# Patient Record
Sex: Male | Born: 1980 | Race: White | Hispanic: No | Marital: Single | Smoking: Never smoker
Health system: Southern US, Community
[De-identification: ages and names within clinical notes are randomized; demographics above are authoritative.]

## PROBLEM LIST (undated history)

## (undated) DIAGNOSIS — M1990 Unspecified osteoarthritis, unspecified site: Secondary | ICD-10-CM

## (undated) DIAGNOSIS — I499 Cardiac arrhythmia, unspecified: Secondary | ICD-10-CM

## (undated) DIAGNOSIS — N200 Calculus of kidney: Secondary | ICD-10-CM

---

## 2018-06-27 ENCOUNTER — Other Ambulatory Visit: Payer: Self-pay

## 2018-06-27 ENCOUNTER — Emergency Department (HOSPITAL_BASED_OUTPATIENT_CLINIC_OR_DEPARTMENT_OTHER)
Admission: EM | Admit: 2018-06-27 | Discharge: 2018-06-28 | Disposition: A | Discharge status: Home / Self Care | Payer: Self-pay | Attending: Emergency Medicine | Admitting: Emergency Medicine

## 2018-06-27 ENCOUNTER — Encounter (HOSPITAL_BASED_OUTPATIENT_CLINIC_OR_DEPARTMENT_OTHER): Payer: Self-pay | Admitting: Adult Health

## 2018-06-27 DIAGNOSIS — N200 Calculus of kidney: Secondary | ICD-10-CM | POA: Insufficient documentation

## 2018-06-27 HISTORY — DX: Unspecified osteoarthritis, unspecified site: M19.90

## 2018-06-27 HISTORY — DX: Calculus of kidney: N20.0

## 2018-06-27 LAB — CBC
HEMATOCRIT: 40.1 % (ref 39.0–52.0)
Hemoglobin: 13.5 g/dL (ref 13.0–17.0)
MCH: 28.9 pg (ref 26.0–34.0)
MCHC: 33.7 g/dL (ref 30.0–36.0)
MCV: 85.9 fL (ref 78.0–100.0)
PLATELETS: 258 10*3/uL (ref 150–400)
RBC: 4.67 MIL/uL (ref 4.22–5.81)
RDW: 12.5 % (ref 11.5–15.5)
WBC: 11.5 10*3/uL — AB (ref 4.0–10.5)

## 2018-06-27 LAB — BASIC METABOLIC PANEL
Anion gap: 9 (ref 5–15)
BUN: 22 mg/dL — AB (ref 6–20)
CALCIUM: 8.6 mg/dL — AB (ref 8.9–10.3)
CO2: 26 mmol/L (ref 22–32)
Chloride: 107 mmol/L (ref 98–111)
Creatinine, Ser: 1.53 mg/dL — ABNORMAL HIGH (ref 0.61–1.24)
GFR calc Af Amer: >60 mL/min (ref >60)
GFR, EST NON AFRICAN AMERICAN: 57 mL/min — AB (ref >60)
Glucose, Bld: 96 mg/dL (ref 70–99)
POTASSIUM: 3.5 mmol/L (ref 3.5–5.1)
SODIUM: 142 mmol/L (ref 135–145)

## 2018-06-27 MED ORDER — SODIUM CHLORIDE 0.9 % IV BOLUS
1000.0000 mL | Freq: Once | INTRAVENOUS | Status: AC
  Administered 2018-06-27: 1000 mL via INTRAVENOUS

## 2018-06-27 MED ORDER — KETOROLAC TROMETHAMINE 30 MG/ML IJ SOLN
30.0000 mg | Freq: Once | INTRAMUSCULAR | Status: AC
  Administered 2018-06-27: 30 mg via INTRAVENOUS
  Filled 2018-06-27: qty 1

## 2018-06-27 MED ORDER — ONDANSETRON HCL 4 MG/2ML IJ SOLN
4.0000 mg | Freq: Once | INTRAMUSCULAR | Status: AC
  Administered 2018-06-27: 4 mg via INTRAVENOUS
  Filled 2018-06-27: qty 2

## 2018-06-27 MED ORDER — HYDROMORPHONE HCL 1 MG/ML IJ SOLN
1.0000 mg | Freq: Once | INTRAMUSCULAR | Status: AC
  Administered 2018-06-27: 1 mg via INTRAVENOUS
  Filled 2018-06-27: qty 1

## 2018-06-27 NOTE — ED Triage Notes (Signed)
PREsents with Right sided flank pain. HE states he has a kidney stone that is stuck and has been there for one month. [facility]. He would like the kidney stone taken out tonight.

## 2018-06-27 NOTE — ED Provider Notes (Signed)
[facility] EMERGENCY DEPARTMENT Provider Note   CSN: [id number] Arrival date & time: 06/27/18  2122     History   Chief Complaint Chief Complaint  Patient presents with  . Flank Pain    HPI Wayne Mayo is a 37 y.o. male.  HPI Patient reports he has had a kidney stone on the right side for about a month.  He has been taking pain medications at home which have sometimes relieve the pain.  He however has had several visits to the emergency department.  He does have a scheduled appointment with urology tomorrow.  He reports his pain got really bad again and he wants to get the stone taken out.  He reports he does not have insurance and he does not think he is going to build to go to the urologist appointment because it will require a payment for service.  He has not had vomiting.  Pain is sharp and on the right side.  It radiates into his testicle. Past Medical History:  Diagnosis Date  . Arthritis   . Kidney stone     There are no active problems to display for this patient.   History reviewed. No pertinent surgical history.      Home Medications    Prior to Admission medications   Medication Sig Start Date End Date Taking? Authorizing Provider  ondansetron (ZOFRAN-ODT) 4 MG disintegrating tablet Take by mouth. 06/25/18 07/02/18 Yes [provider]  oxyCODONE-acetaminophen (PERCOCET/ROXICET) 5-325 MG tablet TAKE 1 TABLET BY MOUTH EVERY 4 HOURS AS NEEDED UP TO FOR 5 DAYS 06/25/18  Yes [provider]  ketorolac (TORADOL) 10 MG tablet Take 10 mg by mouth every 6 (six) hours as needed. 06/20/18   [provider]    Family History History reviewed. No pertinent family history.  Social History Social History   Tobacco Use  . Smoking status: Never Smoker  . Smokeless tobacco: Never Used  Substance Use Topics  . Alcohol use: Never    Frequency: Never  . Drug use: Never     Allergies   Patient has no known allergies.   Review  of Systems Review of Systems 10 Systems reviewed and are negative for acute change except as noted in the HPI.   Physical Exam Updated Vital Signs BP 108/71 (BP Location: Right Arm)   Pulse (!) 57   Temp 97.8 F (36.6 C) (Oral)   Resp 16   Wt 59 kg   SpO2 95%   Physical Exam  Constitutional: He is oriented to person, place, and time. He appears well-developed and well-nourished.  Patient appears to be in moderate pain.  Nontoxic and alert.  HENT:  Head: Normocephalic and atraumatic.  Eyes: EOM are normal.  Cardiovascular: Normal rate, regular rhythm, normal heart sounds and intact distal pulses.  Pulmonary/Chest: Effort normal and breath sounds normal.  Abdominal: Soft. Bowel sounds are normal. He exhibits no distension. There is no tenderness. There is no guarding.  Musculoskeletal: Normal range of motion. He exhibits no edema.  Neurological: He is alert and oriented to person, place, and time. He exhibits normal muscle tone. Coordination normal.  Skin: Skin is warm and dry.  Psychiatric: He has a normal mood and affect.     ED Treatments / Results  Labs (all labs ordered are listed, but only abnormal results are displayed) Labs Reviewed  URINALYSIS, ROUTINE W REFLEX MICROSCOPIC - Abnormal; Notable for the following components:      Result Value   APPearance  CLOUDY (*)    Hgb urine dipstick LARGE (*)    All other components within normal limits  BASIC METABOLIC PANEL - Abnormal; Notable for the following components:   BUN 22 (*)    Creatinine, Ser 1.53 (*)    Calcium 8.6 (*)    GFR calc non Af Amer 57 (*)    All other components within normal limits  CBC - Abnormal; Notable for the following components:   WBC 11.5 (*)    All other components within normal limits  URINALYSIS, MICROSCOPIC (REFLEX)    EKG None  Radiology No results found.  Procedures Procedures (including critical care time)  Medications Ordered in ED Medications  sodium chloride 0.9 %  bolus 1,000 mL (1,000 mLs Intravenous New Bag/Given 06/27/18 2328)  HYDROmorphone (DILAUDID) injection 1 mg (1 mg Intravenous Given 06/27/18 2332)  ondansetron (ZOFRAN) injection 4 mg (4 mg Intravenous Given 06/27/18 2329)  ketorolac (TORADOL) 30 MG/ML injection 30 mg (30 mg Intravenous Given 06/27/18 2330)     Initial Impression / Assessment and Plan / ED Course  I have reviewed the triage vital signs and the nursing notes.  Pertinent labs & imaging results that were available during my care of the patient were reviewed by me and considered in my medical decision making (see chart for details).     Final Clinical Impressions(s) / ED Diagnoses   Final diagnoses:  Kidney stone   Patient's pain is much improved with treatment.  He is alert and nontoxic.  Urinalysis shows no signs of infection.  He had CT scan 2 days ago which did show some movement of his kidney stone.  And has follow-up with urology scheduled within less than 24 hours.  Patient is counseled on return precautions.  He is counseled to strain his urine. ED Discharge Orders    None       Arby BarrettePfeiffer, Theresea Trautmann, MD 06/28/18 [phone]

## 2018-06-27 NOTE — ED Notes (Signed)
ED Provider at bedside. 

## 2018-06-28 LAB — URINALYSIS, ROUTINE W REFLEX MICROSCOPIC
Bilirubin Urine: NEGATIVE
Glucose, UA: NEGATIVE mg/dL
Ketones, ur: NEGATIVE mg/dL
Leukocytes, UA: NEGATIVE
Nitrite: NEGATIVE
PROTEIN: NEGATIVE mg/dL
Specific Gravity, Urine: 1.015 (ref 1.005–1.030)
pH: 5.5 (ref 5.0–8.0)

## 2018-06-28 LAB — URINALYSIS, MICROSCOPIC (REFLEX): Bacteria, UA: NONE SEEN

## 2018-06-28 NOTE — Discharge Instructions (Addendum)
1.  Try to make your scheduled appointment with urology today. 2.  Stay well-hydrated and strain your urine. 3.  If you have severe and worsening pain, return to the emergency department.

## 2018-08-09 ENCOUNTER — Emergency Department (HOSPITAL_BASED_OUTPATIENT_CLINIC_OR_DEPARTMENT_OTHER): Payer: Self-pay

## 2018-08-09 ENCOUNTER — Encounter (HOSPITAL_BASED_OUTPATIENT_CLINIC_OR_DEPARTMENT_OTHER): Payer: Self-pay | Admitting: *Deleted

## 2018-08-09 ENCOUNTER — Other Ambulatory Visit: Payer: Self-pay

## 2018-08-09 ENCOUNTER — Emergency Department (HOSPITAL_BASED_OUTPATIENT_CLINIC_OR_DEPARTMENT_OTHER)
Admission: EM | Admit: 2018-08-09 | Discharge: 2018-08-09 | Disposition: A | Discharge status: Home / Self Care | Payer: Self-pay | Attending: Emergency Medicine | Admitting: Emergency Medicine

## 2018-08-09 DIAGNOSIS — R1084 Generalized abdominal pain: Secondary | ICD-10-CM

## 2018-08-09 DIAGNOSIS — R112 Nausea with vomiting, unspecified: Secondary | ICD-10-CM | POA: Insufficient documentation

## 2018-08-09 DIAGNOSIS — R1031 Right lower quadrant pain: Secondary | ICD-10-CM | POA: Insufficient documentation

## 2018-08-09 LAB — COMPREHENSIVE METABOLIC PANEL
ALT: 33 U/L (ref 0–44)
AST: 23 U/L (ref 15–41)
Albumin: 4.3 g/dL (ref 3.5–5.0)
Alkaline Phosphatase: 101 U/L (ref 38–126)
Anion gap: 9 (ref 5–15)
BUN: 21 mg/dL — ABNORMAL HIGH (ref 6–20)
CO2: 27 mmol/L (ref 22–32)
Calcium: 9.1 mg/dL (ref 8.9–10.3)
Chloride: 105 mmol/L (ref 98–111)
Creatinine, Ser: 0.91 mg/dL (ref 0.61–1.24)
GFR calc Af Amer: >60 mL/min (ref >60)
GFR calc non Af Amer: >60 mL/min (ref >60)
Glucose, Bld: 87 mg/dL (ref 70–99)
Potassium: 4.1 mmol/L (ref 3.5–5.1)
Sodium: 141 mmol/L (ref 135–145)
Total Bilirubin: 0.6 mg/dL (ref 0.3–1.2)
Total Protein: 7 g/dL (ref 6.5–8.1)

## 2018-08-09 LAB — CBC WITH DIFFERENTIAL/PLATELET
Basophils Absolute: 0 10*3/uL (ref 0.0–0.1)
Basophils Relative: 1 %
Eosinophils Absolute: 0.2 10*3/uL (ref 0.0–0.7)
Eosinophils Relative: 2 %
HCT: 47.6 % (ref 39.0–52.0)
Hemoglobin: 16.1 g/dL (ref 13.0–17.0)
Lymphocytes Relative: 24 %
Lymphs Abs: 2 10*3/uL (ref 0.7–4.0)
MCH: 28.6 pg (ref 26.0–34.0)
MCHC: 33.8 g/dL (ref 30.0–36.0)
MCV: 84.7 fL (ref 78.0–100.0)
Monocytes Absolute: 0.6 10*3/uL (ref 0.1–1.0)
Monocytes Relative: 7 %
Neutro Abs: 5.5 10*3/uL (ref 1.7–7.7)
Neutrophils Relative %: 66 %
Platelets: 279 10*3/uL (ref 150–400)
RBC: 5.62 MIL/uL (ref 4.22–5.81)
RDW: 13 % (ref 11.5–15.5)
WBC: 8.3 10*3/uL (ref 4.0–10.5)

## 2018-08-09 LAB — URINALYSIS, ROUTINE W REFLEX MICROSCOPIC
Bilirubin Urine: NEGATIVE
Glucose, UA: NEGATIVE mg/dL
Hgb urine dipstick: NEGATIVE
Ketones, ur: NEGATIVE mg/dL
Leukocytes, UA: NEGATIVE
Nitrite: NEGATIVE
Protein, ur: NEGATIVE mg/dL
Specific Gravity, Urine: >1.03 — ABNORMAL HIGH (ref 1.005–1.030)
pH: 5.5 (ref 5.0–8.0)

## 2018-08-09 LAB — LIPASE, BLOOD: Lipase: 34 U/L (ref 11–51)

## 2018-08-09 MED ORDER — ONDANSETRON HCL 4 MG PO TABS: 4.0000 mg | ORAL_TABLET | Freq: Four times a day (QID) | ORAL | 0 refills | Status: AC | PRN

## 2018-08-09 MED ORDER — MORPHINE SULFATE (PF) 4 MG/ML IV SOLN
4.0000 mg | Freq: Once | INTRAVENOUS | Status: AC
  Administered 2018-08-09: 4 mg via INTRAVENOUS
  Filled 2018-08-09: qty 1

## 2018-08-09 MED ORDER — SODIUM CHLORIDE 0.9 % IV BOLUS
1000.0000 mL | Freq: Once | INTRAVENOUS | Status: AC
  Administered 2018-08-09: 1000 mL via INTRAVENOUS

## 2018-08-09 MED ORDER — IOPAMIDOL (ISOVUE-300) INJECTION 61%
100.0000 mL | Freq: Once | INTRAVENOUS | Status: AC | PRN
  Administered 2018-08-09: 100 mL via INTRAVENOUS

## 2018-08-09 MED ORDER — ONDANSETRON HCL 4 MG/2ML IJ SOLN
4.0000 mg | Freq: Once | INTRAMUSCULAR | Status: AC
  Administered 2018-08-09: 4 mg via INTRAVENOUS
  Filled 2018-08-09: qty 2

## 2018-08-09 NOTE — ED Triage Notes (Signed)
Pt c/o diffuse abd pain x 1 day , seen at [facility] ED this am . Labs resulted , left LWBS

## 2018-08-09 NOTE — ED Provider Notes (Signed)
[facility] EMERGENCY DEPARTMENT Provider Note   CSN: [id number] Arrival date & time: 08/09/18  1832     History   Chief Complaint Chief Complaint  Patient presents with  . Abdominal Pain    HPI Wayne Mayo is a 37 y.o. male.  HPI   37 year old male with diffuse abdominal pain and nausea/vomiting.  Symptom onset 2 days ago.  Persistent since then.  Abdominal pain is diffuse but somewhat worse on the right side.  Associate with nausea and vomited several times.  No diarrhea.  No urinary complaints.  No fevers or chills.  No known sick contacts.  Has not tried taking anything for his symptoms.  Past Medical History:  Diagnosis Date  . Arthritis   . Kidney stone     There are no active problems to display for this patient.   History reviewed. No pertinent surgical history.      Home Medications    Prior to Admission medications   Not on File    Family History History reviewed. No pertinent family history.  Social History Social History   Tobacco Use  . Smoking status: Never Smoker  . Smokeless tobacco: Never Used  Substance Use Topics  . Alcohol use: Never    Frequency: Never  . Drug use: Never     Allergies   Patient has no known allergies.   Review of Systems Review of Systems  All systems reviewed and negative, other than as noted in HPI.  Physical Exam Updated Vital Signs BP 118/77 (BP Location: Left Arm)   Pulse (!) 56   Temp 97.7 F (36.5 C)   Resp 16   Ht 5\' 7"  (1.702 m)   Wt 61.7 kg   SpO2 98%   BMI 21.30 kg/m   Physical Exam  Constitutional: He appears well-developed and well-nourished. No distress.  HENT:  Head: Normocephalic and atraumatic.  Eyes: Conjunctivae are normal. Right eye exhibits no discharge. Left eye exhibits no discharge.  Neck: Neck supple.  Cardiovascular: Normal rate, regular rhythm and normal heart sounds. Exam reveals no gallop and no friction rub.  No murmur heard. Pulmonary/Chest: Effort  normal and breath sounds normal. No respiratory distress.  Abdominal: Soft. He exhibits no distension. There is tenderness.  Tenderness lower abdomen, somewhat worse in RLQ  Musculoskeletal: He exhibits no edema or tenderness.  Neurological: He is alert.  Skin: Skin is warm and dry.  Psychiatric: He has a normal mood and affect. His behavior is normal. Thought content normal.  Nursing note and vitals reviewed.    ED Treatments / Results  Labs (all labs ordered are listed, but only abnormal results are displayed) Labs Reviewed  COMPREHENSIVE METABOLIC PANEL - Abnormal; Notable for the following components:      Result Value   BUN 21 (*)    All other components within normal limits  URINALYSIS, ROUTINE W REFLEX MICROSCOPIC - Abnormal; Notable for the following components:   Specific Gravity, Urine >1.030 (*)    All other components within normal limits  CBC WITH DIFFERENTIAL/PLATELET  LIPASE, BLOOD    EKG None  Radiology Ct Abdomen Pelvis W Contrast  Result Date: 08/09/2018 CLINICAL DATA:  Upper abdominal pain, emesis for 1 week. History of nephrolithiasis EXAM: CT ABDOMEN AND PELVIS WITH CONTRAST TECHNIQUE: Multidetector CT imaging of the abdomen and pelvis was performed using the standard protocol following bolus administration of intravenous contrast. CONTRAST:  ISOVUE-300 IOPAMIDOL (ISOVUE-300) INJECTION 61% COMPARISON:  None. FINDINGS: Lower chest: Dependent bibasilar atelectasis.  Normal heart size. No pericardial or pleural effusion. Hepatobiliary: No focal liver abnormality is seen. No gallstones, gallbladder wall thickening, or biliary dilatation. Pancreas: Unremarkable. No pancreatic ductal dilatation or surrounding inflammatory changes. Spleen: Normal in size without focal abnormality. Adrenals/Urinary Tract: Normal adrenal glands. Minimally complex septated hypodense left renal cyst measures 5.6 cm, image 37 series 2. No renal obstruction, hydronephrosis, perinephric  inflammatory process, hydroureter. No obstructing urinary tract or ureteral calculus on either side. Urinary bladder collapsed. Stomach/Bowel: Negative for bowel obstruction, significant dilatation, ileus, or free air. Normal retrocecal appendix. Minor colonic diverticulosis without acute inflammatory process. No fluid collection or abscess. Vascular/Lymphatic: Intact aorta. Negative for aneurysm. No acute vascular process. Mesenteric and renal vasculature remain patent. No adenopathy. Reproductive: Prostate gland normal in size. Seminal vesicles symmetric. No acute finding by CT. Other: No abdominal wall hernia or abnormality. No abdominopelvic ascites. Musculoskeletal: No acute or significant osseous findings. IMPRESSION: No acute intra-abdominopelvic finding by CT. Minimally complex septated 5.6 cm left renal cyst Normal appendix Minor colonic diverticulosis without acute inflammatory process No fluid collection, obstruction or abscess Electronically Signed   By: Judie Petit.  Shick M.D.   On: 08/09/2018 22:33    Procedures Procedures (including critical care time)  Medications Ordered in ED Medications  sodium chloride 0.9 % bolus 1,000 mL (1,000 mLs Intravenous New Bag/Given 08/09/18 2119)  ondansetron (ZOFRAN) injection 4 mg (4 mg Intravenous Given 08/09/18 2117)  morphine 4 MG/ML injection 4 mg (4 mg Intravenous Given 08/09/18 2117)  iopamidol (ISOVUE-300) 61 % injection 100 mL (100 mLs Intravenous Contrast Given 08/09/18 2208)     Initial Impression / Assessment and Plan / ED Course  I have reviewed the triage vital signs and the nursing notes.  Pertinent labs & imaging results that were available during my care of the patient were reviewed by me and considered in my medical decision making (see chart for details).     37yM with abdominal pain and n/v. Possibly viral GI illness. W/u not consistent with appy, cholelithiasis, urologic emergency, etc. Symptomatic tx.   Final Clinical Impressions(s) /  ED Diagnoses   Final diagnoses:  Generalized abdominal pain  Nausea and vomiting, intractability of vomiting not specified, unspecified vomiting type    ED Discharge Orders    None       Raeford Razor, MD 08/11/18 [phone]

## 2018-09-13 ENCOUNTER — Encounter (HOSPITAL_BASED_OUTPATIENT_CLINIC_OR_DEPARTMENT_OTHER): Payer: Self-pay | Admitting: Emergency Medicine

## 2018-09-13 ENCOUNTER — Other Ambulatory Visit: Payer: Self-pay

## 2018-09-13 ENCOUNTER — Emergency Department (HOSPITAL_BASED_OUTPATIENT_CLINIC_OR_DEPARTMENT_OTHER)
Admission: EM | Admit: 2018-09-13 | Discharge: 2018-09-13 | Disposition: A | Discharge status: Home / Self Care | Payer: Self-pay | Attending: Emergency Medicine | Admitting: Emergency Medicine

## 2018-09-13 ENCOUNTER — Emergency Department (HOSPITAL_BASED_OUTPATIENT_CLINIC_OR_DEPARTMENT_OTHER): Payer: Self-pay

## 2018-09-13 DIAGNOSIS — R079 Chest pain, unspecified: Secondary | ICD-10-CM

## 2018-09-13 DIAGNOSIS — R42 Dizziness and giddiness: Secondary | ICD-10-CM | POA: Insufficient documentation

## 2018-09-13 LAB — CBC
HEMATOCRIT: 45.6 % (ref 39.0–52.0)
HEMOGLOBIN: 14.9 g/dL (ref 13.0–17.0)
MCH: 28.1 pg (ref 26.0–34.0)
MCHC: 32.7 g/dL (ref 30.0–36.0)
MCV: 86 fL (ref 80.0–100.0)
Platelets: 260 10*3/uL (ref 150–400)
RBC: 5.3 MIL/uL (ref 4.22–5.81)
RDW: 12.1 % (ref 11.5–15.5)
WBC: 6.1 10*3/uL (ref 4.0–10.5)
nRBC: 0 % (ref 0.0–0.2)

## 2018-09-13 LAB — D-DIMER, QUANTITATIVE: D-Dimer, Quant: <0.27 ug/mL-FEU (ref 0.00–0.50)

## 2018-09-13 LAB — BASIC METABOLIC PANEL
ANION GAP: 7 (ref 5–15)
BUN: 16 mg/dL (ref 6–20)
CHLORIDE: 105 mmol/L (ref 98–111)
CO2: 27 mmol/L (ref 22–32)
Calcium: 9.1 mg/dL (ref 8.9–10.3)
Creatinine, Ser: 0.84 mg/dL (ref 0.61–1.24)
GFR calc Af Amer: >60 mL/min (ref >60)
Glucose, Bld: 98 mg/dL (ref 70–99)
POTASSIUM: 3.8 mmol/L (ref 3.5–5.1)
SODIUM: 139 mmol/L (ref 135–145)

## 2018-09-13 LAB — TROPONIN I: Troponin I: <0.03 ng/mL (ref <0.03)

## 2018-09-13 MED ORDER — ASPIRIN 325 MG PO TABS: 325.0000 mg | ORAL_TABLET | Freq: Every day | ORAL | 0 refills | Status: AC

## 2018-09-13 MED ORDER — MECLIZINE HCL 25 MG PO TABS: 25.0000 mg | ORAL_TABLET | Freq: Three times a day (TID) | ORAL | 0 refills | Status: AC | PRN

## 2018-09-13 NOTE — Discharge Instructions (Addendum)
We saw in the ER for dizziness and chest discomfort. EKG of the heart and cardiac enzymes are normal and within normal limits. We screen you for blood clot in the lungs, and that test is also normal.  We are not quite sure why you are having the symptoms.  Take the medications as prescribed and follow-up with the outpatient team is requested.  Return to the ER if your symptoms progress, you start developing new symptoms or you faint.

## 2018-09-13 NOTE — ED Triage Notes (Signed)
Reports chest pain which began this morning.  Endorses dizziness.  Reports the pain as constant.  Denies shortness of breath, nausea and vomiting.

## 2018-09-13 NOTE — ED Provider Notes (Addendum)
[facility] EMERGENCY DEPARTMENT Provider Note   CSN: [id number] Arrival date & time: 09/13/18  1046     History   Chief Complaint Chief Complaint  Patient presents with  . Chest Pain    HPI Wayne Mayo is a 37 y.o. male.  HPI  37 year old male with history of kidney stone comes in with chief complaint of chest pain. Patient reports that he started having chest pain this morning with associated dizziness.  Patient's chest pain is constant and located midsternally.  Pain is described as sharp pain that is radiating to both of his sides and patient has associated numbness in his arm, above the left elbow.  Numbness is described as tingling sensation.  Patient's associated dizziness is described as spinning sensation and it is constant.  Patient denies any slurred speech, vision changes, headache, neck pain, focal weakness.  Past Medical History:  Diagnosis Date  . Arthritis   . Kidney stone     There are no active problems to display for this patient.   History reviewed. No pertinent surgical history.      Home Medications    Prior to Admission medications   Medication Sig Start Date End Date Taking? Authorizing Provider  aspirin (BAYER ASPIRIN) 325 MG tablet Take 1 tablet (325 mg total) by mouth daily. 09/13/18   Derwood Kaplan, MD  meclizine (ANTIVERT) 25 MG tablet Take 1 tablet (25 mg total) by mouth 3 (three) times daily as needed for dizziness. 09/13/18   Derwood Kaplan, MD  ondansetron (ZOFRAN) 4 MG tablet Take 1 tablet (4 mg total) by mouth 4 (four) times daily as needed for nausea or vomiting. 08/09/18   Raeford Razor, MD    Family History History reviewed. No pertinent family history.  Social History Social History   Tobacco Use  . Smoking status: Never Smoker  . Smokeless tobacco: Never Used  Substance Use Topics  . Alcohol use: Never    Frequency: Never  . Drug use: Never     Allergies   Patient has no known  allergies.   Review of Systems Review of Systems  Constitutional: Positive for activity change.  Eyes: Negative for visual disturbance.  Respiratory: Negative for shortness of breath.   Cardiovascular: Positive for chest pain.  Gastrointestinal: Negative for abdominal pain, nausea and vomiting.  Neurological: Positive for dizziness, light-headedness and numbness. Negative for tremors, seizures, syncope, facial asymmetry, speech difficulty, weakness and headaches.  All other systems reviewed and are negative.    Physical Exam Updated Vital Signs BP 119/88 (BP Location: Right Arm)   Pulse 63   Resp (!) 21   Ht 5\' 7"  (1.702 m)   Wt 61.7 kg   SpO2 98%   BMI 21.30 kg/m   Physical Exam  Constitutional: He is oriented to person, place, and time. He appears well-developed.  HENT:  Head: Atraumatic.  Neck: Neck supple.  Cardiovascular: Normal rate, intact distal pulses and normal pulses.  Pulmonary/Chest: Effort normal.  Musculoskeletal:       Right lower leg: Normal. He exhibits no tenderness and no edema.       Left lower leg: Normal. He exhibits no tenderness and no edema.  Neurological: He is alert and oriented to person, place, and time. No cranial nerve deficit.  No nystagmus, gross motor exam for upper and lower extremities equal bilaterally.  Patient has subjective numbness over his left arm.  Skin: Skin is warm.  Nursing note and vitals reviewed.    ED Treatments /  Results  Labs (all labs ordered are listed, but only abnormal results are displayed) Labs Reviewed  BASIC METABOLIC PANEL  CBC  TROPONIN I  D-DIMER, QUANTITATIVE (NOT AT [facility])  TROPONIN I    EKG EKG Interpretation  Date/Time:  Monday September 13 2018 10:55:39 EST Ventricular Rate:  67 PR Interval:    QRS Duration: 104 QT Interval:  395 QTC Calculation: 417 R Axis:   110 Text Interpretation:  Sinus rhythm Right axis deviation No acute changes No old tracing to compare Confirmed by Derwood Kaplan [phone]) on 09/13/2018 11:00:40 AM   Radiology Dg Chest 2 View  Result Date: 09/13/2018 CLINICAL DATA:  Chest pain radiating to the left arm. EXAM: CHEST - 2 VIEW COMPARISON:  08/28/2018 FINDINGS: Normal heart size and mediastinal contours. No acute infiltrate or edema. No effusion or pneumothorax. No acute osseous findings. IMPRESSION: Negative chest. Electronically Signed   By: Marnee Spring M.D.   On: 09/13/2018 11:11    Procedures Procedures (including critical care time)  Medications Ordered in ED Medications - No data to display   Initial Impression / Assessment and Plan / ED Course  I have reviewed the triage vital signs and the nursing notes.  Pertinent labs & imaging results that were available during my care of the patient were reviewed by me and considered in my medical decision making (see chart for details).  Clinical Course as of Sep 13 1333  Mon Sep 13, 2018  1329 Delta troponin is negative. Repeat neuro exam was completed, and it remained fine.  On telemetry patient is noted to have heart rate that drops in the upper 40s, but there is no associated abnormalities on the intervals.  Patient is comfortable going home right now.  We have advised him to start taking aspirin.  He will return to the ER if he starts having worsening of his symptoms, he has new neurologic symptoms, progression of the current neurologic symptoms or syncope.   [AN]    Clinical Course User Index [AN] Derwood Kaplan, MD    37 year old male comes in with chief complaint of chest pain, dizziness and left arm numbness.  He has been having chest pain off and on for the last several days it appears and has been seen at outside hospital for it.  His current pain started this morning when he woke up.  He describes the pain as sharp pain that is radiating bilaterally. Patient does not have any family history of premature CAD, he denies smoking, substance abuse.  There is no history of PE, DVT and  no risk factors for the same.  She is well score is rates low probability for PE and he is PERC negative.  Still will get a d-dimer. She does not have any risk factors for dissection.  He has intact and equal pulses for both radial and dorsalis pedis region.  D-dimer has shown reasonable sensitivity to dissection and a low suspicion, therefore dimer will screen patient for dissection as well. I doubt that patient has ACS given that he has been having recurrent nonspecific chest pain.  Patient's heart score is 1, with one being for history.  Troponin will be ordered x 2.  Unsure what to make of patient's dizziness.  His neurologic exam is completely nonfocal.  As mentioned earlier, dissection is not high on the differential diagnosis. We will likely treat patient with Antivert.  He has been advised to follow-up with PCP.Marland Kitchen  Final Clinical Impressions(s) / ED Diagnoses  Final diagnoses:  Dizziness  Nonspecific chest pain    ED Discharge Orders         Ordered    aspirin (BAYER ASPIRIN) 325 MG tablet  Daily     09/13/18 1333    meclizine (ANTIVERT) 25 MG tablet  3 times daily PRN     09/13/18 1333           Derwood Kaplan, MD 09/13/18 1257    Derwood Kaplan, MD 09/13/18 1334

## 2018-09-13 NOTE — ED Notes (Signed)
Patient transported to X-ray 

## 2018-09-13 NOTE — ED Notes (Addendum)
Upon standing, pt felt "off balance" , nauseous and  A little pain in center of sternum. Reported to RN

## 2018-09-16 ENCOUNTER — Other Ambulatory Visit: Payer: Self-pay

## 2018-09-16 ENCOUNTER — Emergency Department (HOSPITAL_BASED_OUTPATIENT_CLINIC_OR_DEPARTMENT_OTHER)
Admission: EM | Admit: 2018-09-16 | Discharge: 2018-09-16 | Disposition: A | Discharge status: Home / Self Care | Payer: Self-pay | Attending: Emergency Medicine | Admitting: Emergency Medicine

## 2018-09-16 ENCOUNTER — Emergency Department (HOSPITAL_BASED_OUTPATIENT_CLINIC_OR_DEPARTMENT_OTHER): Payer: Self-pay

## 2018-09-16 ENCOUNTER — Encounter (HOSPITAL_BASED_OUTPATIENT_CLINIC_OR_DEPARTMENT_OTHER): Payer: Self-pay

## 2018-09-16 DIAGNOSIS — Z79899 Other long term (current) drug therapy: Secondary | ICD-10-CM | POA: Insufficient documentation

## 2018-09-16 DIAGNOSIS — R0789 Other chest pain: Secondary | ICD-10-CM | POA: Insufficient documentation

## 2018-09-16 LAB — COMPREHENSIVE METABOLIC PANEL
ALK PHOS: 106 U/L (ref 38–126)
ALT: 24 U/L (ref 0–44)
AST: 17 U/L (ref 15–41)
Albumin: 4.2 g/dL (ref 3.5–5.0)
Anion gap: 9 (ref 5–15)
BUN: 24 mg/dL — AB (ref 6–20)
CHLORIDE: 106 mmol/L (ref 98–111)
CO2: 24 mmol/L (ref 22–32)
CREATININE: 0.86 mg/dL (ref 0.61–1.24)
Calcium: 8.9 mg/dL (ref 8.9–10.3)
GFR calc Af Amer: >60 mL/min (ref >60)
GFR calc non Af Amer: >60 mL/min (ref >60)
GLUCOSE: 99 mg/dL (ref 70–99)
Potassium: 4.1 mmol/L (ref 3.5–5.1)
SODIUM: 139 mmol/L (ref 135–145)
Total Bilirubin: 0.9 mg/dL (ref 0.3–1.2)
Total Protein: 7.2 g/dL (ref 6.5–8.1)

## 2018-09-16 LAB — CBC
HCT: 46.5 % (ref 39.0–52.0)
HEMOGLOBIN: 15.3 g/dL (ref 13.0–17.0)
MCH: 28.1 pg (ref 26.0–34.0)
MCHC: 32.9 g/dL (ref 30.0–36.0)
MCV: 85.5 fL (ref 80.0–100.0)
NRBC: 0 % (ref 0.0–0.2)
PLATELETS: 306 10*3/uL (ref 150–400)
RBC: 5.44 MIL/uL (ref 4.22–5.81)
RDW: 12.2 % (ref 11.5–15.5)
WBC: 10.8 10*3/uL — ABNORMAL HIGH (ref 4.0–10.5)

## 2018-09-16 LAB — TROPONIN I

## 2018-09-16 MED ORDER — DICLOFENAC SODIUM 1 % TD GEL: 2.0000 g | Freq: Four times a day (QID) | TRANSDERMAL | 0 refills | Status: AC

## 2018-09-16 NOTE — ED Provider Notes (Signed)
[facility] EMERGENCY DEPARTMENT Provider Note   CSN: [id number] Arrival date & time: 09/16/18  1557     History   Chief Complaint Chief Complaint  Patient presents with  . Chest Pain    HPI Wayne Mayo is a 37 y.o. male with a history of nephrolithiasis and arthritis who presents to the emergency department with a chief complaint of chest pain.  The patient endorses a sudden onset, constant chest pain that began at 1600 while he was laying in his bed.  He states the pain originally began in the middle of his chest and radiated to his left shoulder blade.  He characterized the initial pain as " feeling like my rib cage was being ripped out".  He reports that pain has since subsided and he is now having some right-sided, constant, nonradiating chest pain characterized as sharp.  He reports an associated nonproductive cough and states that when he coughs that his chest pain worsens.  No known alleviating factors.  No treatment prior to arrival.  He denies neck pain, left arm pain, weakness, diaphoresis, dyspnea,  He states that he also felt febrile earlier today and checked his temperature and it was "100.something".  He reports associated rhinorrhea and states that he did have an episode of nonbloody diarrhea earlier today, which is new.  He also endorses nausea.   He reports he has had a right-sided headache with previous episodes of chest pain, but none today.  He also reports that he is previously had dizziness and feeling off balance with previous episodes of chest pain, but none today.  He also denies abdominal pain, vomiting, constipation, rotations, leg swelling, URI symptoms, sore throat, or visual changes.   He states that since arriving to the emergency department that his bilateral shins are feeling numb.  No history of similar.  He states he has been having intermittent night sweats for the last few months that come and go.  Reports that previously he was working 2  jobs and his weight went from 180 pounds down to 136 pounds unintentionally, but he now weighs 176 pounds.   Reports he was seen for chest pain on 09/13/2018 and was told that he had an irregular heartbeat and was told that he will likely need a pacemaker at some point in the future.  He is scheduled for an initial appointment with cardiology on 09/20/2018.  Family hx includes a maternal uncle who had an MI in his 9s.  He works as a Production assistant, radio at [employer].  No personal or family history of PE.  No recent long travel or immobilization.  He is a non-smoker, denies alcohol use, and IV recreational drug use.  States that he follows a strict cardiac healthy diet after being told by a doctor, he cannot remember home, several months ago to stay healthy.  He works out approximately 3 times a week and performs cardio.  The history is provided by the patient. No language interpreter was used.    Past Medical History:  Diagnosis Date  . Arthritis   . Kidney stone     There are no active problems to display for this patient.   History reviewed. No pertinent surgical history.      Home Medications    Prior to Admission medications   Medication Sig Start Date End Date Taking? Authorizing Provider  aspirin (BAYER ASPIRIN) 325 MG tablet Take 1 tablet (325 mg total) by mouth daily. 09/13/18   Derwood Kaplan, MD  diclofenac  sodium (VOLTAREN) 1 % GEL Apply 2 g topically 4 (four) times daily. 09/16/18   Anniebelle Devore A, PA-C  meclizine (ANTIVERT) 25 MG tablet Take 1 tablet (25 mg total) by mouth 3 (three) times daily as needed for dizziness. 09/13/18   Derwood Kaplan, MD  ondansetron (ZOFRAN) 4 MG tablet Take 1 tablet (4 mg total) by mouth 4 (four) times daily as needed for nausea or vomiting. 08/09/18   Raeford Razor, MD    Family History No family history on file.  Social History Social History   Tobacco Use  . Smoking status: Never Smoker  . Smokeless tobacco: Never Used  Substance Use Topics   . Alcohol use: Never    Frequency: Never  . Drug use: Never     Allergies   Patient has no known allergies.   Review of Systems Review of Systems  Constitutional: Positive for fever and unexpected weight change (resolved). Negative for appetite change, chills and diaphoresis.  HENT: Negative for congestion, drooling, sinus pressure, sinus pain, sneezing, sore throat and tinnitus.   Eyes: Negative for visual disturbance.  Respiratory: Positive for cough. Negative for choking, chest tightness, shortness of breath and wheezing.   Cardiovascular: Positive for chest pain. Negative for palpitations and leg swelling.  Gastrointestinal: Positive for diarrhea and nausea. Negative for abdominal pain, constipation and vomiting.  Genitourinary: Negative for dysuria.  Musculoskeletal: Negative for back pain.  Skin: Negative for rash.  Allergic/Immunologic: Negative for immunocompromised state.  Neurological: Positive for numbness. Negative for dizziness, syncope, weakness and headaches.  Psychiatric/Behavioral: Negative for confusion.   Physical Exam Updated Vital Signs BP 113/80   Pulse 68   Temp 98.5 F (36.9 C) (Oral)   Resp 14   Ht 5\' 7"  (1.702 m)   Wt 79.8 kg   SpO2 97%   BMI 27.57 kg/m   Physical Exam  Constitutional: He is oriented to person, place, and time. He appears well-developed and well-nourished. No distress.  Well-appearing.  HENT:  Head: Normocephalic and atraumatic.  Right Ear: External ear normal.  Left Ear: External ear normal.  Mouth/Throat: Oropharynx is clear and moist.  Eyes: Pupils are equal, round, and reactive to light. Conjunctivae and EOM are normal.  Neck: Normal range of motion. Neck supple. No JVD present. No tracheal deviation present.  Cardiovascular: Normal rate, regular rhythm, normal heart sounds and intact distal pulses. Exam reveals no gallop and no friction rub.  No murmur heard. Pulmonary/Chest: Effort normal and breath sounds normal. No  stridor. No respiratory distress. He has no wheezes. He has no rales. He exhibits no tenderness.  Producible tenderness to palpation to the bilateral anterior chest wall without crepitus or step-offs.  Breath sounds are equal.  Symmetric chest expansion.  Abdominal: Soft. Bowel sounds are normal. He exhibits no distension and no mass. There is no tenderness. There is no rebound and no guarding. No hernia.  Musculoskeletal: He exhibits no edema, tenderness or deformity.  No tenderness to the cervical, thoracic, lumbar spinous processes or bilateral paraspinal muscles.  Neurological: He is alert and oriented to person, place, and time.  Skin: Skin is warm and dry. Capillary refill takes less than 2 seconds. No rash noted. He is not diaphoretic. No pallor.  Psychiatric: His behavior is normal.  Nursing note and vitals reviewed.    ED Treatments / Results  Labs (all labs ordered are listed, but only abnormal results are displayed) Labs Reviewed  CBC - Abnormal; Notable for the following components:  Result Value   WBC 10.8 (*)    All other components within normal limits  COMPREHENSIVE METABOLIC PANEL - Abnormal; Notable for the following components:   BUN 24 (*)    All other components within normal limits  TROPONIN I    EKG EKG Interpretation  Date/Time:  Thursday September 16 2018 16:03:49 EST Ventricular Rate:  85 PR Interval:  148 QRS Duration: 92 QT Interval:  346 QTC Calculation: 411 R Axis:   124 Text Interpretation:  Normal sinus rhythm Left posterior fascicular block Abnormal ECG No significant change since last tracing Confirmed by Alvira Monday ([id number]) on 09/16/2018 5:19:07 PM   Radiology Dg Chest 2 View  Result Date: 09/16/2018 CLINICAL DATA:  Left-sided chest pain. EXAM: CHEST - 2 VIEW COMPARISON:  September 13, 2018 FINDINGS: The heart size and mediastinal contours are within normal limits. Both lungs are clear. The visualized skeletal structures are  unremarkable. IMPRESSION: No active cardiopulmonary disease. Electronically Signed   By: Gerome Sam III M.D   On: 09/16/2018 19:49    Procedures Procedures (including critical care time)  Medications Ordered in ED Medications - No data to display   Initial Impression / Assessment and Plan / ED Course  I have reviewed the triage vital signs and the nursing notes.  Pertinent labs & imaging results that were available during my care of the patient were reviewed by me and considered in my medical decision making (see chart for details).     Patient is to be discharged with recommendation to follow up with PCP in regards to today's hospital visit. Chest pain is not likely of cardiac or pulmonary etiology d/t presentation, PERC negative, VSS, no tracheal deviation, no JVD or new murmur, RRR, breath sounds equal bilaterally, EKG without acute abnormalities, negative troponin, and negative CXR.  Delta troponin is not indicated at this time.  Pt has been advised to return to the ED if CP becomes exertional, associated with diaphoresis or nausea, radiates to left jaw/arm, worsens or becomes concerning in any way. Pt appears reliable for follow up and is agreeable to discharge.  Patient has follow-up with cardiology on November 11, I have encouraged him to keep this appointment.  Will discharge with the diclofenac gel since patient had reproducible chest pain to palpation and has a history of costochondritis.  Case has been discussed with Dr. Dalene Seltzer who agrees with the above plan to discharge.    Final Clinical Impressions(s) / ED Diagnoses   Final diagnoses:  Atypical chest pain    ED Discharge Orders         Ordered    diclofenac sodium (VOLTAREN) 1 % GEL  4 times daily     09/16/18 2000           Osha Errico A, PA-C 09/16/18 2207    Alvira Monday, MD 09/17/18 1306

## 2018-09-16 NOTE — ED Notes (Signed)
Pt verbalizes understanding of d/c instructions and denies any further needs at this time. 

## 2018-09-16 NOTE — ED Notes (Signed)
Substernal chest pain that started today. Seen multiple times for the same and various facilities.

## 2018-09-16 NOTE — ED Notes (Signed)
Pt on monitor 

## 2018-09-16 NOTE — ED Notes (Signed)
ED Provider at bedside. 

## 2018-09-16 NOTE — Discharge Instructions (Signed)
Thank you for allowing me to care for you today in the Emergency Department.   Apply a thin layer of diclofenac gel to the chest wall 3-4 times daily for no more than 7 days.  If your pain does not improve with this, you can try taking at thousand milligrams of Tylenol every 8 hours.  Since diclofenac is in the same family as ibuprofen and naproxen, do not take these medications while you are using this gel.  Keep your appointment with cardiology on November 11.  Return to the emergency department if you develop new or worsening symptoms including severe shortness of breath, persistent vomiting, chest pain that is significantly worse with exertion, chest pain with sweating, weakness in your legs, or other new, concerning symptoms.

## 2018-09-16 NOTE — ED Triage Notes (Addendum)
C/o CP today-pt with ED visits x 3 in the last month for same-was told he had irregular HR-pt states he has cards appt 11/11 set up through "[facility]" -NAD-steady gait

## 2018-10-21 ENCOUNTER — Encounter (HOSPITAL_BASED_OUTPATIENT_CLINIC_OR_DEPARTMENT_OTHER): Payer: Self-pay | Admitting: *Deleted

## 2018-10-21 ENCOUNTER — Other Ambulatory Visit: Payer: Self-pay

## 2018-10-21 ENCOUNTER — Emergency Department (HOSPITAL_BASED_OUTPATIENT_CLINIC_OR_DEPARTMENT_OTHER): Payer: Self-pay

## 2018-10-21 ENCOUNTER — Emergency Department (HOSPITAL_BASED_OUTPATIENT_CLINIC_OR_DEPARTMENT_OTHER)
Admission: EM | Admit: 2018-10-21 | Discharge: 2018-10-21 | Disposition: A | Discharge status: Home / Self Care | Payer: Self-pay | Attending: Emergency Medicine | Admitting: Emergency Medicine

## 2018-10-21 DIAGNOSIS — R05 Cough: Secondary | ICD-10-CM | POA: Insufficient documentation

## 2018-10-21 DIAGNOSIS — R059 Cough, unspecified: Secondary | ICD-10-CM

## 2018-10-21 DIAGNOSIS — R0981 Nasal congestion: Secondary | ICD-10-CM | POA: Insufficient documentation

## 2018-10-21 DIAGNOSIS — R0789 Other chest pain: Secondary | ICD-10-CM | POA: Insufficient documentation

## 2018-10-21 DIAGNOSIS — Z7982 Long term (current) use of aspirin: Secondary | ICD-10-CM | POA: Insufficient documentation

## 2018-10-21 HISTORY — DX: Cardiac arrhythmia, unspecified: I49.9

## 2018-10-21 LAB — BASIC METABOLIC PANEL
ANION GAP: 8 (ref 5–15)
BUN: 13 mg/dL (ref 6–20)
CO2: 24 mmol/L (ref 22–32)
Calcium: 9.2 mg/dL (ref 8.9–10.3)
Chloride: 106 mmol/L (ref 98–111)
Creatinine, Ser: 0.73 mg/dL (ref 0.61–1.24)
GLUCOSE: 107 mg/dL — AB (ref 70–99)
Potassium: 3.5 mmol/L (ref 3.5–5.1)
Sodium: 138 mmol/L (ref 135–145)

## 2018-10-21 LAB — CBC
HCT: 45.4 % (ref 39.0–52.0)
Hemoglobin: 14.9 g/dL (ref 13.0–17.0)
MCH: 27.9 pg (ref 26.0–34.0)
MCHC: 32.8 g/dL (ref 30.0–36.0)
MCV: 84.9 fL (ref 80.0–100.0)
Platelets: 273 10*3/uL (ref 150–400)
RBC: 5.35 MIL/uL (ref 4.22–5.81)
RDW: 12.4 % (ref 11.5–15.5)
WBC: 9 10*3/uL (ref 4.0–10.5)
nRBC: 0 % (ref 0.0–0.2)

## 2018-10-21 LAB — TROPONIN I
Troponin I: <0.03 ng/mL (ref <0.03)
Troponin I: <0.03 ng/mL (ref <0.03)

## 2018-10-21 MED ORDER — KETOROLAC TROMETHAMINE 15 MG/ML IJ SOLN
15.0000 mg | Freq: Once | INTRAMUSCULAR | Status: AC
  Administered 2018-10-21: 15 mg via INTRAVENOUS
  Filled 2018-10-21: qty 1

## 2018-10-21 NOTE — ED Triage Notes (Signed)
Chest pain since last night, feeling nauseous, light headed and left eye getting blurry

## 2018-10-21 NOTE — ED Provider Notes (Signed)
[facility] EMERGENCY DEPARTMENT Provider Note   CSN: [id number] Arrival date & time: 10/21/18  1009     History   Chief Complaint Chief Complaint  Patient presents with  . Chest Pain    HPI Wayne DanielsDaniel Mayo is a 37 y.o. male presenting for evaluation of chest pain.  Patient states last night while he was in bed, he had acute onset chest pain.  He took some aspirin, and states chest pain improved.  This morning, chest pain returned, and has been constant since.  He has associated mild nausea without vomiting.  He denies associated shortness of breath.  Patient states this first began this morning, he felt his vision become blurry bilaterally, although this resolved without intervention.  Patient reports increasing cough last night and today.  He has associated nasal congestion which began yesterday.  He denies fevers, chills, ear pain, sore throat, abdominal pain, urinary symptoms, normal bowel movements.  Patient states he has an irregular heartbeat, but no other cardiac disease.  No CAD.  Dad had a heart attack at 40.  He denies tobacco, alcohol, or drug use.  He denies history of diabetes, hypertension, or hyperlipidemia.  He has no other medical problems, takes no medications daily.  He denies recent travel, surgeries, immobilization, history of cancer, history of previous PE/DVT, or hormone use.  Additional history obtained from chart review.  Patient seen twice last month for atypical chest pain.  Each time with a reassuring cardiac work-up.  Patient states he has followed up with cardiology since, had reassuring work-up and no further plans for cardiology monitoring or intervention.  HPI  Past Medical History:  Diagnosis Date  . Arthritis   . Irregular heart beat   . kidney stones     There are no active problems to display for this patient.   History reviewed. No pertinent surgical history.    Home Medications    Prior to Admission medications   Medication  Sig Start Date End Date Taking? Authorizing Provider  aspirin (BAYER ASPIRIN) 325 MG tablet Take 1 tablet (325 mg total) by mouth daily. 09/13/18  Yes Derwood KaplanNanavati, Ankit, MD  ondansetron (ZOFRAN) 4 MG tablet Take 1 tablet (4 mg total) by mouth 4 (four) times daily as needed for nausea or vomiting. 08/09/18  Yes Raeford RazorKohut, Stephen, MD  diclofenac sodium (VOLTAREN) 1 % GEL Apply 2 g topically 4 (four) times daily. 09/16/18   McDonald, Mia A, PA-C  meclizine (ANTIVERT) 25 MG tablet Take 1 tablet (25 mg total) by mouth 3 (three) times daily as needed for dizziness. 09/13/18   Derwood KaplanNanavati, Ankit, MD    Family History History reviewed. No pertinent family history.  Social History Social History   Tobacco Use  . Smoking status: Never Smoker  . Smokeless tobacco: Never Used  Substance Use Topics  . Alcohol use: Never    Frequency: Never  . Drug use: Never     Allergies   Patient has no known allergies.   Review of Systems Review of Systems  HENT: Positive for congestion.   Eyes: Positive for visual disturbance (resolved).  Respiratory: Positive for cough.   Cardiovascular: Positive for chest pain.  Gastrointestinal: Positive for nausea.  All other systems reviewed and are negative.    Physical Exam Updated Vital Signs BP (!) 128/91 (BP Location: Right Arm)   Pulse 72   Temp 98.2 F (36.8 C) (Oral)   Resp 18   Ht 5\' 7"  (1.702 m)   Wt 79.8 kg  SpO2 97%   BMI 27.57 kg/m   Physical Exam Vitals signs and nursing note reviewed.  Constitutional:      General: He is not in acute distress.    Appearance: He is well-developed.     Comments: Appears nontoxic  HENT:     Head: Normocephalic and atraumatic.     Comments: OP clear without tonsillar swelling or exudate.  Uvula midline with palate rise.  TMs nonerythematous nonbulging bilaterally.  Mild nasal mucosal edema.    Nose: Congestion present.  Eyes:     Conjunctiva/sclera: Conjunctivae normal.     Pupils: Pupils are equal, round,  and reactive to light.  Neck:     Musculoskeletal: Normal range of motion and neck supple.  Cardiovascular:     Rate and Rhythm: Normal rate and regular rhythm.     Comments: HR regular Pulmonary:     Effort: Pulmonary effort is normal. No respiratory distress.     Breath sounds: Normal breath sounds. No wheezing.     Comments: Speaking in full sentences.  Clear lung sounds in all fields.  Tenderness palpation over the left upper chest.  No obvious deformity. Chest:     Chest wall: Tenderness present.  Abdominal:     General: Bowel sounds are normal. There is no distension.     Palpations: Abdomen is soft.     Tenderness: There is no abdominal tenderness.  Musculoskeletal: Normal range of motion.        General: No swelling or tenderness.     Comments: Pedal pulses intact bilaterally.  No leg pain or swelling.  Skin:    General: Skin is warm and dry.     Capillary Refill: Capillary refill takes less than 2 seconds.  Neurological:     Mental Status: He is alert and oriented to person, place, and time.      ED Treatments / Results  Labs (all labs ordered are listed, but only abnormal results are displayed) Labs Reviewed  BASIC METABOLIC PANEL - Abnormal; Notable for the following components:      Result Value   Glucose, Bld 107 (*)    All other components within normal limits  BASIC METABOLIC PANEL  CBC  TROPONIN I  TROPONIN I    EKG EKG Interpretation  Date/Time:  Thursday October 21 2018 10:17:58 EST Ventricular Rate:  76 PR Interval:    QRS Duration: 104 QT Interval:  368 QTC Calculation: 414 R Axis:   33 Text Interpretation:  Sinus rhythm Confirmed by Vanetta Mulders [phone]) on 10/21/2018 11:14:48 AM   Radiology Dg Chest 2 View  Result Date: 10/21/2018 CLINICAL DATA:  Cough, chest pain EXAM: CHEST - 2 VIEW COMPARISON:  09/16/2018 FINDINGS: Heart and mediastinal contours are within normal limits. No focal opacities or effusions. No acute bony abnormality.  IMPRESSION: No active cardiopulmonary disease. Electronically Signed   By: Charlett Nose M.D.   On: 10/21/2018 11:09    Procedures Procedures (including critical care time)  Medications Ordered in ED Medications  ketorolac (TORADOL) 15 MG/ML injection 15 mg (15 mg Intravenous Given 10/21/18 1057)     Initial Impression / Assessment and Plan / ED Course  I have reviewed the triage vital signs and the nursing notes.  Pertinent labs & imaging results that were available during my care of the patient were reviewed by me and considered in my medical decision making (see chart for details).     Presenting for evaluation of chest pain.  Physical exam reassuring, he  appears nontoxic.  Cardiac exam reassuring, regular rate, no irregular rhythm noted.  With associated cough, nasal congestion and chest wall tenderness, consider URI causing symptoms.  However, considering dad's history of MI at 85, patient's history of irregular heart rate, will order cardiac labs, EKG, and chest x-ray.  Toradol for pain.  EKG without STEMI.  Chest x-ray viewed interpreted by me, no pneumonia, pneumothorax, effusion, or cardiomegaly. troponin negative. Cbc without leukocytosis, hgb stable.  Initial BMP very abnormal with multiple critical results.  This is not consistent with patient's picture, I am not convinced that this was hemolyzed.  Will redraw BMP and reassess.  Repeat BMP reassuring, irregularities have resolved.  Peak troponin drawn as well in case of irregularity, was also negative.  As such, low suspicion for ACS at this time.  Heart score 1, low risk. Low suspicion for PE or pulmonary cause.  Consider URI causing chest pain, cough causing chest pain, and/or anxiety.  Discussed findings with patient.  Discussed follow-up with primary care as needed.  At this time, patient be safe for discharge.  Return precautions given.  Patient states he understands and agrees plan.  Final Clinical Impressions(s) / ED  Diagnoses   Final diagnoses:  Atypical chest pain  Cough  Nasal congestion    ED Discharge Orders    None       Alveria Apley, PA-C 10/21/18 1321    Vanetta Mulders, MD 10/22/18 0745

## 2018-10-21 NOTE — Discharge Instructions (Addendum)
Take tylenol and ibuprofen as needed for pain.  Make sure you are staying well hydrated with water.  Follow up with your primary care doctor as needed if symptoms are not improving.,  Return to the ER with any new, worsening, or concerning symptoms.

## 2018-10-21 NOTE — ED Notes (Signed)
ED Provider at bedside. 

## 2019-02-28 ENCOUNTER — Emergency Department (HOSPITAL_BASED_OUTPATIENT_CLINIC_OR_DEPARTMENT_OTHER)
Admission: EM | Admit: 2019-02-28 | Discharge: 2019-02-28 | Disposition: A | Discharge status: Home / Self Care | Payer: HRSA Program | Attending: Emergency Medicine | Admitting: Emergency Medicine

## 2019-02-28 ENCOUNTER — Other Ambulatory Visit: Payer: Self-pay

## 2019-02-28 ENCOUNTER — Encounter (HOSPITAL_BASED_OUTPATIENT_CLINIC_OR_DEPARTMENT_OTHER): Payer: Self-pay | Admitting: Emergency Medicine

## 2019-02-28 ENCOUNTER — Emergency Department (HOSPITAL_BASED_OUTPATIENT_CLINIC_OR_DEPARTMENT_OTHER): Payer: HRSA Program

## 2019-02-28 DIAGNOSIS — R6889 Other general symptoms and signs: Secondary | ICD-10-CM

## 2019-02-28 DIAGNOSIS — Z20822 Contact with and (suspected) exposure to covid-19: Secondary | ICD-10-CM

## 2019-02-28 DIAGNOSIS — Z20828 Contact with and (suspected) exposure to other viral communicable diseases: Secondary | ICD-10-CM | POA: Diagnosis not present

## 2019-02-28 DIAGNOSIS — R059 Cough, unspecified: Secondary | ICD-10-CM

## 2019-02-28 DIAGNOSIS — J069 Acute upper respiratory infection, unspecified: Secondary | ICD-10-CM | POA: Insufficient documentation

## 2019-02-28 DIAGNOSIS — Z79899 Other long term (current) drug therapy: Secondary | ICD-10-CM | POA: Insufficient documentation

## 2019-02-28 DIAGNOSIS — R079 Chest pain, unspecified: Secondary | ICD-10-CM | POA: Diagnosis present

## 2019-02-28 DIAGNOSIS — R05 Cough: Secondary | ICD-10-CM

## 2019-02-28 LAB — CBC WITH DIFFERENTIAL/PLATELET
Abs Immature Granulocytes: 0.02 10*3/uL (ref 0.00–0.07)
Basophils Absolute: 0.1 10*3/uL (ref 0.0–0.1)
Basophils Relative: 1 %
Eosinophils Absolute: 0.1 10*3/uL (ref 0.0–0.5)
Eosinophils Relative: 2 %
HCT: 44.1 % (ref 39.0–52.0)
Hemoglobin: 14.5 g/dL (ref 13.0–17.0)
Immature Granulocytes: 0 %
Lymphocytes Relative: 31 %
Lymphs Abs: 2.6 10*3/uL (ref 0.7–4.0)
MCH: 28.2 pg (ref 26.0–34.0)
MCHC: 32.9 g/dL (ref 30.0–36.0)
MCV: 85.6 fL (ref 80.0–100.0)
Monocytes Absolute: 0.7 10*3/uL (ref 0.1–1.0)
Monocytes Relative: 8 %
Neutro Abs: 4.7 10*3/uL (ref 1.7–7.7)
Neutrophils Relative %: 58 %
Platelets: 273 10*3/uL (ref 150–400)
RBC: 5.15 MIL/uL (ref 4.22–5.81)
RDW: 12.3 % (ref 11.5–15.5)
WBC: 8.2 10*3/uL (ref 4.0–10.5)
nRBC: 0 % (ref 0.0–0.2)

## 2019-02-28 LAB — COMPREHENSIVE METABOLIC PANEL
ALT: 42 U/L (ref 0–44)
AST: 22 U/L (ref 15–41)
Albumin: 4 g/dL (ref 3.5–5.0)
Alkaline Phosphatase: 105 U/L (ref 38–126)
Anion gap: 6 (ref 5–15)
BUN: 10 mg/dL (ref 6–20)
CO2: 26 mmol/L (ref 22–32)
Calcium: 9.2 mg/dL (ref 8.9–10.3)
Chloride: 108 mmol/L (ref 98–111)
Creatinine, Ser: 0.78 mg/dL (ref 0.61–1.24)
GFR calc Af Amer: >60 mL/min (ref >60)
GFR calc non Af Amer: >60 mL/min (ref >60)
Glucose, Bld: 121 mg/dL — ABNORMAL HIGH (ref 70–99)
Potassium: 3.4 mmol/L — ABNORMAL LOW (ref 3.5–5.1)
Sodium: 140 mmol/L (ref 135–145)
Total Bilirubin: 0.5 mg/dL (ref 0.3–1.2)
Total Protein: 6.8 g/dL (ref 6.5–8.1)

## 2019-02-28 LAB — TROPONIN I: Troponin I: <0.03 ng/mL (ref <0.03)

## 2019-02-28 MED ORDER — KETOROLAC TROMETHAMINE 30 MG/ML IJ SOLN
30.0000 mg | Freq: Once | INTRAMUSCULAR | Status: DC
  Filled 2019-02-28: qty 1

## 2019-02-28 MED ORDER — LOPERAMIDE HCL 2 MG PO CAPS: 2.0000 mg | ORAL_CAPSULE | Freq: Four times a day (QID) | ORAL | 0 refills | Status: AC | PRN

## 2019-02-28 MED ORDER — KETOROLAC TROMETHAMINE 30 MG/ML IJ SOLN
30.0000 mg | Freq: Once | INTRAMUSCULAR | Status: AC
  Administered 2019-02-28: 02:00:00 30 mg via INTRAVENOUS

## 2019-02-28 MED ORDER — IBUPROFEN 600 MG PO TABS: 600.0000 mg | ORAL_TABLET | Freq: Four times a day (QID) | ORAL | 0 refills | Status: DC | PRN

## 2019-02-28 NOTE — ED Notes (Signed)
Pt understood dc material, including self Mudlogger. NAD noted. Scripts given at [facility]. All questions answered to satisfaction. Pt escorted to check out counter

## 2019-02-28 NOTE — Discharge Instructions (Addendum)
You were seen today for multiple symptoms.  This could be COVID-19.  Self isolate for at least 7 days from onset of symptoms and 3 days symptom-free.  Take ibuprofen as needed for body aches or pain.  Imodium for diarrhea.  If you develop shortness of breath, any new or worsening symptoms you should be reevaluated.

## 2019-02-28 NOTE — ED Notes (Signed)
ED Provider at bedside. 

## 2019-02-28 NOTE — ED Provider Notes (Signed)
[facility] EMERGENCY DEPARTMENT Provider Note   CSN: [id number] Arrival date & time: 02/28/19  0053    History   Chief Complaint Chief Complaint  Patient presents with  . Cough  . Chest Pain  . Generalized Body Aches    HPI Wayne Mayo is a 38 y.o. male.     HPI  This is a 38 year old male with no significant past medical history who presents with chest pain, shoulder pain, sore throat, diarrhea.  Patient reports that over the last week he has developed chest discomfort which he describes as dull and nonradiating.  Currently his pain is a 4 out of 10.  He has had a nonproductive "deep" cough.  He reports chills without fevers.  He states that he has been achy all over and has had decreased appetite.  He also reports multiple episodes of diarrhea onset today.  No episodes of vomiting.  No documented fevers.  Denies any sick contacts or known COVID exposures.  Patient reports that he is concerned that he may have COVID.    Past Medical History:  Diagnosis Date  . Arthritis   . Irregular heart beat   . kidney stones     There are no active problems to display for this patient.   History reviewed. No pertinent surgical history.      Home Medications    Prior to Admission medications   Medication Sig Start Date End Date Taking? Authorizing Provider  aspirin (BAYER ASPIRIN) 325 MG tablet Take 1 tablet (325 mg total) by mouth daily. 09/13/18   Derwood KaplanNanavati, Ankit, MD  diclofenac sodium (VOLTAREN) 1 % GEL Apply 2 g topically 4 (four) times daily. 09/16/18   McDonald, Mia A, PA-C  ibuprofen (ADVIL) 600 MG tablet Take 1 tablet (600 mg total) by mouth every 6 (six) hours as needed. 02/28/19   Mylinh Cragg, Mayer Maskerourtney F, MD  loperamide (IMODIUM) 2 MG capsule Take 1 capsule (2 mg total) by mouth 4 (four) times daily as needed for diarrhea or loose stools. 02/28/19   Eldred Lievanos, Mayer Maskerourtney F, MD  meclizine (ANTIVERT) 25 MG tablet Take 1 tablet (25 mg total) by mouth 3 (three) times daily  as needed for dizziness. 09/13/18   Derwood KaplanNanavati, Ankit, MD  ondansetron (ZOFRAN) 4 MG tablet Take 1 tablet (4 mg total) by mouth 4 (four) times daily as needed for nausea or vomiting. 08/09/18   Raeford RazorKohut, Stephen, MD    Family History No family history on file.  Social History Social History   Tobacco Use  . Smoking status: Never Smoker  . Smokeless tobacco: Never Used  Substance Use Topics  . Alcohol use: Never    Frequency: Never  . Drug use: Never     Allergies   Penicillins   Review of Systems Review of Systems  Constitutional: Positive for chills. Negative for fever.  HENT: Positive for sore throat.   Respiratory: Positive for cough. Negative for shortness of breath.   Cardiovascular: Positive for chest pain. Negative for leg swelling.  Gastrointestinal: Positive for diarrhea. Negative for abdominal pain, nausea and vomiting.  Genitourinary: Negative for dysuria.  Skin: Negative for rash.  Neurological: Negative for headaches.  All other systems reviewed and are negative.    Physical Exam Updated Vital Signs BP 114/77   Pulse 69   Temp 99.1 F (37.3 C) (Oral)   Resp 18   Ht 1.702 m (5\' 7" )   Wt 84.8 kg   SpO2 97%   BMI 29.29 kg/m   Physical  Exam Vitals signs and nursing note reviewed.  Constitutional:      Appearance: He is well-developed.  HENT:     Head: Normocephalic and atraumatic.     Comments: Posterior oropharynx slightly erythematous, no tonsillar exudate, uvula midline    Mouth/Throat:     Mouth: Mucous membranes are moist.  Eyes:     Pupils: Pupils are equal, round, and reactive to light.  Neck:     Musculoskeletal: Neck supple.  Cardiovascular:     Rate and Rhythm: Normal rate and regular rhythm.     Heart sounds: Normal heart sounds. No murmur.  Pulmonary:     Effort: Pulmonary effort is normal. No respiratory distress.     Breath sounds: Normal breath sounds. No wheezing.  Abdominal:     General: Bowel sounds are normal.      Palpations: Abdomen is soft.     Tenderness: There is no abdominal tenderness. There is no rebound.  Musculoskeletal:     Right lower leg: No edema.     Left lower leg: No edema.  Lymphadenopathy:     Cervical: No cervical adenopathy.  Skin:    General: Skin is warm and dry.  Neurological:     Mental Status: He is alert and oriented to person, place, and time.  Psychiatric:        Mood and Affect: Mood normal.      ED Treatments / Results  Labs (all labs ordered are listed, but only abnormal results are displayed) Labs Reviewed  COMPREHENSIVE METABOLIC PANEL - Abnormal; Notable for the following components:      Result Value   Potassium 3.4 (*)    Glucose, Bld 121 (*)    All other components within normal limits  CBC WITH DIFFERENTIAL/PLATELET  TROPONIN I    EKG EKG Interpretation  Date/Time:  Monday February 28 2019 01:00:32 EDT Ventricular Rate:  74 PR Interval:    QRS Duration: 105 QT Interval:  385 QTC Calculation: 428 R Axis:   -10 Text Interpretation:  Sinus rhythm Baseline wander in lead(s) V2 Confirmed by Ross Marcus ([id number]) on 02/28/2019 1:46:12 AM   Radiology Dg Chest Port 1 View  Result Date: 02/28/2019 CLINICAL DATA:  Chest pain, cough EXAM: PORTABLE CHEST 1 VIEW COMPARISON:  10/21/2018 FINDINGS: Heart and mediastinal contours are within normal limits. No focal opacities or effusions. No acute bony abnormality. IMPRESSION: No active disease. Electronically Signed   By: Charlett Nose M.D.   On: 02/28/2019 02:15    Procedures Procedures (including critical care time)  Medications Ordered in ED Medications  ketorolac (TORADOL) 30 MG/ML injection 30 mg (30 mg Intravenous Given 02/28/19 0144)     Initial Impression / Assessment and Plan / ED Course  I have reviewed the triage vital signs and the nursing notes.  Pertinent labs & imaging results that were available during my care of the patient were reviewed by me and considered in my medical decision  making (see chart for details).        Patient presents with multiple complaints.  Concern for COVID-19 infection.  He is overall nontoxic-appearing.  Afebrile.  Vital signs are reassuring.  No respiratory distress.  His exam is fairly benign.  X-ray shows no evidence of pneumothorax or pneumonia.  EKG shows no evidence of arrhythmia or ischemia.  Basic lab work obtained and reassuring.  Doubt ACS.  No significant leukocytosis.  Patient improved with Toradol.  I discussed with the patient that his symptoms are nonspecific but given  prevalence of current pandemic, would recommend isolation as directed by the CDC.  Otherwise symptom control with ibuprofen and Imodium recommended.  After history, exam, and medical workup I feel the patient has been appropriately medically screened and is safe for discharge home. Pertinent diagnoses were discussed with the patient. Patient was given return precautions.   Wayne Mayo was evaluated in Emergency Department on 02/28/2019 for the symptoms described in the history of present illness. He was evaluated in the context of the global COVID-19 pandemic, which necessitated consideration that the patient might be at risk for infection with the SARS-CoV-2 virus that causes COVID-19. Institutional protocols and algorithms that pertain to the evaluation of patients at risk for COVID-19 are in a state of rapid change based on information released by regulatory bodies including the CDC and federal and state organizations. These policies and algorithms were followed during the patient's care in the ED.   Final Clinical Impressions(s) / ED Diagnoses   Final diagnoses:  Viral upper respiratory tract infection  Suspected Covid-19 Virus Infection    ED Discharge Orders         Ordered    ibuprofen (ADVIL) 600 MG tablet  Every 6 hours PRN     02/28/19 0248    loperamide (IMODIUM) 2 MG capsule  4 times daily PRN     02/28/19 0248           Shon Baton, MD  02/28/19 [phone]

## 2019-02-28 NOTE — ED Triage Notes (Signed)
Pt states that for the past week he has been experiencing chest pain and an abnormal cough. Deep and longer than his normal cough. States he is experiencing aching all over but mainly in between shoulder blades. Loss of appetite.  Diarrhea today. States 20 episodes diarrhea today. Doesn't state sick contact but states he lives in a hotel

## 2019-03-16 ENCOUNTER — Other Ambulatory Visit: Payer: Self-pay

## 2019-03-16 ENCOUNTER — Emergency Department (HOSPITAL_COMMUNITY)
Admission: EM | Admit: 2019-03-16 | Discharge: 2019-03-17 | Disposition: A | Discharge status: Home / Self Care | Payer: Self-pay | Attending: Emergency Medicine | Admitting: Emergency Medicine

## 2019-03-16 DIAGNOSIS — R55 Syncope and collapse: Secondary | ICD-10-CM | POA: Insufficient documentation

## 2019-03-16 NOTE — ED Provider Notes (Signed)
[facility] EMERGENCY DEPARTMENT Provider Note  CSN: [id number] Arrival date & time: 03/16/19 2255  Chief Complaint(s) Loss of Consciousness  HPI Wayne Mayo is a 38 y.o. male   The history is provided by the patient.  Loss of Consciousness  Episode history:  Single Duration: approx 5 min. Chronicity:  Recurrent Context: standing up   Context: not blood draw, not dehydration, not medication change, not with normal activity, not sight of blood and not urination   Context comment:  Patient reports that he had just layed down after taking a hot shower and got up to turn on the A/C. Witnessed: no   Relieved by:  Nothing Worsened by:  Nothing Associated symptoms: malaise/fatigue (for several weeks)   Associated symptoms: no confusion, no diaphoresis, no fever, no focal weakness, no palpitations, no recent fall, no recent injury, no seizures, no shortness of breath and no vomiting     Past Medical History Past Medical History:  Diagnosis Date  . Arthritis   . Irregular heart beat   . kidney stones    There are no active problems to display for this patient.  Home Medication(s) Prior to Admission medications   Medication Sig Start Date End Date Taking? Authorizing Provider  aspirin (BAYER ASPIRIN) 325 MG tablet Take 1 tablet (325 mg total) by mouth daily. 09/13/18   Derwood Kaplan, MD  diclofenac sodium (VOLTAREN) 1 % GEL Apply 2 g topically 4 (four) times daily. 09/16/18   McDonald, Mia A, PA-C  ibuprofen (ADVIL) 600 MG tablet Take 1 tablet (600 mg total) by mouth every 6 (six) hours as needed. 02/28/19   Horton, Mayer Masker, MD  loperamide (IMODIUM) 2 MG capsule Take 1 capsule (2 mg total) by mouth 4 (four) times daily as needed for diarrhea or loose stools. 02/28/19   Horton, Mayer Masker, MD  meclizine (ANTIVERT) 25 MG tablet Take 1 tablet (25 mg total) by mouth 3 (three) times daily as needed for dizziness. 09/13/18   Derwood Kaplan, MD  ondansetron (ZOFRAN) 4  MG tablet Take 1 tablet (4 mg total) by mouth 4 (four) times daily as needed for nausea or vomiting. 08/09/18   Raeford Razor, MD                                                                                                                                    Past Surgical History No past surgical history on file. Family History No family history on file.  Social History Social History   Tobacco Use  . Smoking status: Never Smoker  . Smokeless tobacco: Never Used  Substance Use Topics  . Alcohol use: Never    Frequency: Never  . Drug use: Never   Allergies Penicillins  Review of Systems Review of Systems  Constitutional: Positive for malaise/fatigue (for several weeks). Negative for diaphoresis and fever.  Respiratory: Negative for shortness of breath.   Cardiovascular: Positive for syncope.  Negative for palpitations.  Gastrointestinal: Negative for vomiting.  Neurological: Negative for focal weakness and seizures.  Psychiatric/Behavioral: Negative for confusion.   All other systems are reviewed and are negative for acute change except as noted in the HPI  Physical Exam Vital Signs  I have reviewed the triage vital signs BP (!) 136/92 (BP Location: Right Arm)   Pulse 74   Temp 98.8 F (37.1 C) (Oral)   Resp 16   Ht 5\' 7"  (1.702 m)   Wt 79.4 kg   BMI 27.41 kg/m   Physical Exam Vitals signs reviewed.  Constitutional:      General: He is not in acute distress.    Appearance: He is well-developed. He is not diaphoretic.  HENT:     Head: Normocephalic and atraumatic. No Battle's sign.      Nose: Nose normal.  Eyes:     General: No scleral icterus.       Right eye: No discharge.        Left eye: No discharge.     Conjunctiva/sclera: Conjunctivae normal.     Pupils: Pupils are equal, round, and reactive to light.  Neck:     Musculoskeletal: Normal range of motion and neck supple.  Cardiovascular:     Rate and Rhythm: Normal rate and regular rhythm.     Heart  sounds: No murmur. No friction rub. No gallop.   Pulmonary:     Effort: Pulmonary effort is normal. No respiratory distress.     Breath sounds: Normal breath sounds. No stridor. No rales.  Abdominal:     General: There is no distension.     Palpations: Abdomen is soft.     Tenderness: There is no abdominal tenderness.  Musculoskeletal:        General: No tenderness.  Skin:    General: Skin is warm and dry.     Findings: No erythema or rash.  Neurological:     Mental Status: He is alert and oriented to person, place, and time.     Comments: Mental Status:  Alert and oriented to person, place, and time.  Attention and concentration normal.  Speech clear.  Recent memory is intact  Cranial Nerves:  II Visual Fields: Intact to confrontation. Visual fields intact. III, IV, VI: Pupils equal and reactive to light and near. Full eye movement without nystagmus  V Facial Sensation: Normal. No weakness of masticatory muscles  VII: No facial weakness or asymmetry  VIII Auditory Acuity: Grossly normal  IX/X: The uvula is midline; the palate elevates symmetrically  XI: Normal sternocleidomastoid and trapezius strength  XII: The tongue is midline. No atrophy or fasciculations.   Motor System: Muscle Strength: 5/5 and symmetric in the upper and lower extremities. No pronation or drift.  Muscle Tone: Tone and muscle bulk are normal in the upper and lower extremities.   Reflexes: DTRs: 1+ and symmetrical in all four extremities. No Clonus Coordination: Intact finger-to-nose. No tremor.  Sensation: Intact to light touch. Negative Romberg test.  Gait: Routine gait normal.      ED Results and Treatments Labs (all labs ordered are listed, but only abnormal results are displayed) Labs Reviewed - No data to display  EKG  EKG Interpretation  Date/Time:  Wednesday Mar 16 2019  23:12:56 EDT Ventricular Rate:  72 PR Interval:    QRS Duration: 105 QT Interval:  377 QTC Calculation: 413 R Axis:   0 Text Interpretation:  Sinus rhythm No significant change since last tracing Confirmed by Drema Pry [phone]) on 03/16/2019 11:30:13 PM      Radiology No results found. Pertinent labs & imaging results that were available during my care of the patient were reviewed by me and considered in my medical decision making (see chart for details).  Medications Ordered in ED Medications - No data to display                                                                                                                                  Procedures Procedures  (including critical care time)  Medical Decision Making / ED Course I have reviewed the nursing notes for this encounter and the patient's prior records (if available in EHR or on provided paperwork).    Patient presents with a syncopal episode in the setting of hot shower and standing from a laying position.  Consistent with vasovagal/orthostasis.  EKG reassuring without dysrhythmia, blocks, evidence of HOCM, Brugada or epsilon waves.  Patient is afebrile with stable vital signs and well-appearing.  Orthostatic blood pressures reassuring.  He did have mild increase in heart rate but did not meet criteria for orthostasis.  Hydrate orally.  Reassessed and able to ambulate without complication.  The patient appears reasonably screened and/or stabilized for discharge and I doubt any other medical condition or other [facility] requiring further screening, evaluation, or treatment in the ED at this time prior to discharge.  The patient is safe for discharge with strict return precautions.   Final Clinical Impression(s) / ED Diagnoses Final diagnoses:  Syncope and collapse    Disposition: Discharge  Condition: Good  I have discussed the results, Dx and Tx plan with the patient who expressed understanding and agree(s) with  the plan. Discharge instructions discussed at great length. The patient was given strict return precautions who verbalized understanding of the instructions. No further questions at time of discharge.    ED Discharge Orders    None       Follow Up: Primary care provider  Schedule an appointment as soon as possible for a visit       This chart was dictated using voice recognition software.  Despite best efforts to proofread,  errors can occur which can change the documentation meaning.   Nira Conn, MD 03/17/19 [phone]

## 2019-03-16 NOTE — ED Triage Notes (Signed)
Pt reports unwitnessed syncopal episode around 2230 tonight. Pt states episode lasted approximately 5 minutes. Pt denies injury during episode. Pt currently complains of headache that he rates 7/10.  He states he has not felt well for the past couple weeks and states he has felt fatigued. Pt denies fever, nausea, or vomiting.

## 2019-03-17 NOTE — ED Notes (Signed)
Pt stands for Ortho VS on own ability. Strong and steady on feet. Some minor light-headed feelings. Pt given 8oz water.

## 2019-03-19 ENCOUNTER — Emergency Department (HOSPITAL_COMMUNITY)
Admission: EM | Admit: 2019-03-19 | Discharge: 2019-03-19 | Disposition: A | Discharge status: Home / Self Care | Payer: Self-pay | Attending: Emergency Medicine | Admitting: Emergency Medicine

## 2019-03-19 ENCOUNTER — Other Ambulatory Visit: Payer: Self-pay

## 2019-03-19 ENCOUNTER — Emergency Department (HOSPITAL_COMMUNITY): Payer: Self-pay

## 2019-03-19 DIAGNOSIS — Z7982 Long term (current) use of aspirin: Secondary | ICD-10-CM | POA: Insufficient documentation

## 2019-03-19 DIAGNOSIS — F32A Depression, unspecified: Secondary | ICD-10-CM

## 2019-03-19 DIAGNOSIS — R0789 Other chest pain: Secondary | ICD-10-CM

## 2019-03-19 DIAGNOSIS — Z79899 Other long term (current) drug therapy: Secondary | ICD-10-CM | POA: Insufficient documentation

## 2019-03-19 DIAGNOSIS — E876 Hypokalemia: Secondary | ICD-10-CM

## 2019-03-19 DIAGNOSIS — F329 Major depressive disorder, single episode, unspecified: Secondary | ICD-10-CM | POA: Insufficient documentation

## 2019-03-19 DIAGNOSIS — R45851 Suicidal ideations: Secondary | ICD-10-CM | POA: Insufficient documentation

## 2019-03-19 LAB — RAPID URINE DRUG SCREEN, HOSP PERFORMED
Amphetamines: NOT DETECTED
Barbiturates: NOT DETECTED
Benzodiazepines: NOT DETECTED
Cocaine: NOT DETECTED
Opiates: NOT DETECTED
Tetrahydrocannabinol: NOT DETECTED

## 2019-03-19 LAB — TROPONIN I
Troponin I: <0.03 ng/mL (ref <0.03)
Troponin I: <0.03 ng/mL (ref <0.03)

## 2019-03-19 LAB — ETHANOL: Alcohol, Ethyl (B): <10 mg/dL (ref <10)

## 2019-03-19 LAB — CBC
HCT: 44.7 % (ref 39.0–52.0)
Hemoglobin: 15.1 g/dL (ref 13.0–17.0)
MCH: 28.3 pg (ref 26.0–34.0)
MCHC: 33.8 g/dL (ref 30.0–36.0)
MCV: 83.7 fL (ref 80.0–100.0)
Platelets: 312 10*3/uL (ref 150–400)
RBC: 5.34 MIL/uL (ref 4.22–5.81)
RDW: 12.2 % (ref 11.5–15.5)
WBC: 10.9 10*3/uL — ABNORMAL HIGH (ref 4.0–10.5)
nRBC: 0 % (ref 0.0–0.2)

## 2019-03-19 LAB — SALICYLATE LEVEL: Salicylate Lvl: <7 mg/dL (ref 2.8–30.0)

## 2019-03-19 LAB — ACETAMINOPHEN LEVEL: Acetaminophen (Tylenol), Serum: <10 ug/mL — ABNORMAL LOW (ref 10–30)

## 2019-03-19 LAB — BASIC METABOLIC PANEL
Anion gap: 13 (ref 5–15)
BUN: 14 mg/dL (ref 6–20)
CO2: 23 mmol/L (ref 22–32)
Calcium: 9.4 mg/dL (ref 8.9–10.3)
Chloride: 106 mmol/L (ref 98–111)
Creatinine, Ser: 1.01 mg/dL (ref 0.61–1.24)
GFR calc Af Amer: >60 mL/min (ref >60)
GFR calc non Af Amer: >60 mL/min (ref >60)
Glucose, Bld: 100 mg/dL — ABNORMAL HIGH (ref 70–99)
Potassium: 3.4 mmol/L — ABNORMAL LOW (ref 3.5–5.1)
Sodium: 142 mmol/L (ref 135–145)

## 2019-03-19 MED ORDER — POTASSIUM CHLORIDE CRYS ER 20 MEQ PO TBCR
40.0000 meq | EXTENDED_RELEASE_TABLET | Freq: Once | ORAL | Status: AC
  Administered 2019-03-19: 40 meq via ORAL
  Filled 2019-03-19: qty 2

## 2019-03-19 MED ORDER — ACETAMINOPHEN 325 MG PO TABS
650.0000 mg | ORAL_TABLET | Freq: Once | ORAL | Status: AC
  Administered 2019-03-19: 650 mg via ORAL
  Filled 2019-03-19: qty 2

## 2019-03-19 MED ORDER — SODIUM CHLORIDE 0.9% FLUSH: 3.0000 mL | Freq: Once | INTRAVENOUS | Status: DC

## 2019-03-19 NOTE — Discharge Instructions (Addendum)
See your therapist on Tuesday, as scheduled.  You might benefit from seeing a psychiatrist to see if medication would be appropriate.

## 2019-03-19 NOTE — ED Provider Notes (Signed)
[facility] EMERGENCY DEPARTMENT Provider Note   CSN: [id number] Arrival date & time: 03/19/19  0101    History   Chief Complaint Chief Complaint  Patient presents with  . Chest Pain  . Suicidal    HPI Wayne Mayo is a 38 y.o. male.   The history is provided by the patient.  Chest Pain  He came in because of chest pain which started about 10 minutes before he got to the hospital.  He describes a sharp left sided chest pain without radiation.  He rates the pain at 6/10.  Nothing makes it better, nothing makes it worse.  There was mild associated nausea without vomiting.  He denies dyspnea or diaphoresis.  He took Aleve before coming to the hospital, but had no relief.  He has had similar pains over the last 6 months and has been evaluated multiple times without a diagnosis being made.  He is a non-smoker and denies history of hypertension, diabetes, hyperlipidemia.  There is no family history of premature coronary atherosclerosis in first-degree relatives.  He denies travel, recent surgery, cancer, exogenous estrogen use.  Also, he got into an argument with his wife and had brief suicidal thoughts without any specific plan.  He no longer feels suicidal.  He does admit to depression with associated early morning wakening and anhedonia.  He denies crying spells and denies hallucinations.  He does see a therapist for his depression.  He is scheduled to see the therapist in 3 days.  Past Medical History:  Diagnosis Date  . Arthritis   . Irregular heart beat   . kidney stones     There are no active problems to display for this patient.   No past surgical history on file.      Home Medications    Prior to Admission medications   Medication Sig Start Date End Date Taking? Authorizing Provider  aspirin (BAYER ASPIRIN) 325 MG tablet Take 1 tablet (325 mg total) by mouth daily. 09/13/18   Derwood Kaplan, MD  diclofenac sodium (VOLTAREN) 1 % GEL Apply 2 g  topically 4 (four) times daily. 09/16/18   McDonald, Mia A, PA-C  ibuprofen (ADVIL) 600 MG tablet Take 1 tablet (600 mg total) by mouth every 6 (six) hours as needed. 02/28/19   Horton, Mayer Masker, MD  loperamide (IMODIUM) 2 MG capsule Take 1 capsule (2 mg total) by mouth 4 (four) times daily as needed for diarrhea or loose stools. 02/28/19   Horton, Mayer Masker, MD  meclizine (ANTIVERT) 25 MG tablet Take 1 tablet (25 mg total) by mouth 3 (three) times daily as needed for dizziness. 09/13/18   Derwood Kaplan, MD  ondansetron (ZOFRAN) 4 MG tablet Take 1 tablet (4 mg total) by mouth 4 (four) times daily as needed for nausea or vomiting. 08/09/18   Raeford Razor, MD    Family History No family history on file.  Social History Social History   Tobacco Use  . Smoking status: Never Smoker  . Smokeless tobacco: Never Used  Substance Use Topics  . Alcohol use: Never    Frequency: Never  . Drug use: Never     Allergies   Penicillins   Review of Systems Review of Systems  Cardiovascular: Positive for chest pain.  All other systems reviewed and are negative.    Physical Exam Updated Vital Signs BP 131/89   Pulse 64   Temp 98.4 F (36.9 C) (Oral)   Resp 15   SpO2 98%  Physical Exam Vitals signs and nursing note reviewed.    38 year old male, resting comfortably and in no acute distress. Vital signs are normal. Oxygen saturation is 98%, which is normal. Head is normocephalic and atraumatic. PERRLA, EOMI. Oropharynx is clear. Neck is nontender and supple without adenopathy or JVD. Back is nontender and there is no CVA tenderness. Lungs are clear without rales, wheezes, or rhonchi. Chest is nontender. Heart has regular rate and rhythm without murmur. Abdomen is soft, flat, nontender without masses or hepatosplenomegaly and peristalsis is normoactive. Extremities have no cyanosis or edema, full range of motion is present. Skin is warm and dry without rash. Neurologic: Mental  status is normal, cranial nerves are intact, there are no motor or sensory deficits.  ED Treatments / Results  Labs (all labs ordered are listed, but only abnormal results are displayed) Labs Reviewed  BASIC METABOLIC PANEL - Abnormal; Notable for the following components:      Result Value   Potassium 3.4 (*)    Glucose, Bld 100 (*)    All other components within normal limits  CBC - Abnormal; Notable for the following components:   WBC 10.9 (*)    All other components within normal limits  ACETAMINOPHEN LEVEL - Abnormal; Notable for the following components:   Acetaminophen (Tylenol), Serum <10 (*)    All other components within normal limits  TROPONIN I  ETHANOL  SALICYLATE LEVEL  RAPID URINE DRUG SCREEN, HOSP PERFORMED  TROPONIN I    EKG EKG Interpretation  Date/Time:  Saturday Mar 19 2019 01:11:43 EDT Ventricular Rate:  78 PR Interval:  154 QRS Duration: 102 QT Interval:  366 QTC Calculation: 417 R Axis:   -7 Text Interpretation:  Sinus rhythm with Blocked Premature atrial complexes Otherwise normal ECG When compared with ECG of 03/16/2019, Blocked Premature atrial complexes are now present Confirmed by Dione BoozeGlick, Vincenzo Stave ([id number]) on 03/19/2019 4:01:46 AM   Radiology Dg Chest 2 View  Result Date: 03/19/2019 CLINICAL DATA:  38 y/o  M; Chest Pain. EXAM: CHEST - 2 VIEW COMPARISON:  02/28/2019 chest radiograph FINDINGS: Stable heart size and mediastinal contours are within normal limits. Both lungs are clear. The visualized skeletal structures are unremarkable. IMPRESSION: No acute pulmonary process identified. Electronically Signed   By: Mitzi HansenLance  Furusawa-Stratton M.D.   On: 03/19/2019 01:56    Procedures Procedures   Medications Ordered in ED Medications  potassium chloride SA (K-DUR) CR tablet 40 mEq (has no administration in time range)  acetaminophen (TYLENOL) tablet 650 mg (has no administration in time range)     Initial Impression / Assessment and Plan / ED Course  I  have reviewed the triage vital signs and the nursing notes.  Pertinent labs & imaging results that were available during my care of the patient were reviewed by me and considered in my medical decision making (see chart for details).  Atypical chest pain.  Old records are reviewed, and this is actually his 10th ED visit for chest pain over the last year.  He has no significant cardiac risk factors.  ECG is significant only for nonconducted PAC.  Chest x-ray is normal.  Labs show mild hypokalemia and are otherwise normal including normal troponin.  He has had a d-dimer checked in the past and was normal.  Specific cause for his pain is not clear, but I see no evidence of ACS, pulmonary embolism, aortic dissection, pneumonia, pericarditis.  Will check delta troponin.  He is given a dose of oral potassium.  Because of multiple ED visits, I feel he would benefit from cardiology evaluation and possible stress testing.  Repeat troponin is normal.  TTS evaluation had been ordered by nursing, and they confirm that patient does not meet inpatient criteria.  He is referred to cardiology to evaluate his chest pain and is given mental health resources to see about being evaluated by a psychiatrist to see if medication would be appropriate for his depression.  Final Clinical Impressions(s) / ED Diagnoses   Final diagnoses:  Atypical chest pain  Hypokalemia  Depression, unspecified depression type    ED Discharge Orders    None       Dione Booze, MD 03/19/19 [phone]

## 2019-03-19 NOTE — ED Notes (Signed)
Pt wanded by security. 

## 2019-03-19 NOTE — ED Notes (Signed)
Pt verbalized that he is no longer having SI thoughts. Explained that he and his wife got into an argument earlier and he said it out of anger. Informed him of plan and awaiting TTS call

## 2019-03-19 NOTE — ED Notes (Signed)
TTS machine placed at bedside.   

## 2019-03-19 NOTE — Progress Notes (Signed)
Per Nira Conn, NP pt does not meet criteria for inpt tx. Pt will be faxed OPT resources to follow up with regarding medication management. EDP Dr. Preston Fleeting, MD and pt's nurse Maralyn Sago, RN have been advised.  Wayne Mayo, MSW, LCSW Therapeutic Triage Specialist  [phone]
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
## 2019-03-19 NOTE — ED Triage Notes (Addendum)
Pt arrives tonight having 7/10 left sided chest pain with associated dizziness. 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 No sob/ afebrile. Pt now alerts RN he is feeling suicidal. Denies ingesting any drugs and/or alcholol.

## 2019-09-20 ENCOUNTER — Emergency Department (HOSPITAL_COMMUNITY)
Admission: EM | Admit: 2019-09-20 | Discharge: 2019-09-20 | Disposition: A | Discharge status: Home / Self Care | Payer: BC Managed Care – PPO | Attending: Emergency Medicine | Admitting: Emergency Medicine

## 2019-09-20 ENCOUNTER — Other Ambulatory Visit: Payer: Self-pay

## 2019-09-20 ENCOUNTER — Encounter (HOSPITAL_COMMUNITY): Payer: Self-pay | Admitting: Emergency Medicine

## 2019-09-20 DIAGNOSIS — Z7982 Long term (current) use of aspirin: Secondary | ICD-10-CM | POA: Insufficient documentation

## 2019-09-20 DIAGNOSIS — J020 Streptococcal pharyngitis: Secondary | ICD-10-CM | POA: Diagnosis not present

## 2019-09-20 DIAGNOSIS — J029 Acute pharyngitis, unspecified: Secondary | ICD-10-CM | POA: Diagnosis present

## 2019-09-20 LAB — GROUP A STREP BY PCR: Group A Strep by PCR: NOT DETECTED

## 2019-09-20 LAB — BASIC METABOLIC PANEL
Anion gap: 9 (ref 5–15)
BUN: 13 mg/dL (ref 6–20)
CO2: 23 mmol/L (ref 22–32)
Calcium: 8.9 mg/dL (ref 8.9–10.3)
Chloride: 108 mmol/L (ref 98–111)
Creatinine, Ser: 0.98 mg/dL (ref 0.61–1.24)
GFR calc Af Amer: >60 mL/min (ref >60)
GFR calc non Af Amer: >60 mL/min (ref >60)
Glucose, Bld: 95 mg/dL (ref 70–99)
Potassium: 3.5 mmol/L (ref 3.5–5.1)
Sodium: 140 mmol/L (ref 135–145)

## 2019-09-20 LAB — CBC
HCT: 43.6 % (ref 39.0–52.0)
Hemoglobin: 14.8 g/dL (ref 13.0–17.0)
MCH: 28.6 pg (ref 26.0–34.0)
MCHC: 33.9 g/dL (ref 30.0–36.0)
MCV: 84.3 fL (ref 80.0–100.0)
Platelets: 295 10*3/uL (ref 150–400)
RBC: 5.17 MIL/uL (ref 4.22–5.81)
RDW: 12.2 % (ref 11.5–15.5)
WBC: 9.8 10*3/uL (ref 4.0–10.5)
nRBC: 0 % (ref 0.0–0.2)

## 2019-09-20 MED ORDER — IBUPROFEN 400 MG PO TABS
600.0000 mg | ORAL_TABLET | Freq: Once | ORAL | Status: AC
  Administered 2019-09-20: 600 mg via ORAL
  Filled 2019-09-20: qty 1

## 2019-09-20 MED ORDER — PREDNISONE 20 MG PO TABS: 40.0000 mg | ORAL_TABLET | Freq: Every day | ORAL | 0 refills | Status: AC

## 2019-09-20 MED ORDER — DEXAMETHASONE SODIUM PHOSPHATE 10 MG/ML IJ SOLN
10.0000 mg | Freq: Once | INTRAMUSCULAR | Status: AC
  Administered 2019-09-20: 21:00:00 10 mg via INTRAMUSCULAR
  Filled 2019-09-20: qty 1

## 2019-09-20 MED ORDER — IBUPROFEN 600 MG PO TABS: 600.0000 mg | ORAL_TABLET | Freq: Four times a day (QID) | ORAL | 0 refills | Status: AC | PRN

## 2019-09-20 NOTE — Discharge Instructions (Signed)
Please take steroids and anti-inflammatories for the next 5 days as directed.  Follow-up with ENT since this has been a recurrent issue.

## 2019-09-20 NOTE — ED Triage Notes (Signed)
Pt here from home with c/o sore throat , throat is red and slightly swollen in the back , nad , pt is able to drink water but was afraid to eat throughout the day

## 2019-09-20 NOTE — ED Provider Notes (Signed)
[facility] EMERGENCY DEPARTMENT Provider Note   CSN: [id number] Arrival date & time: 09/20/19  1717     History   Chief Complaint Chief Complaint  Patient presents with  . Sore Throat    HPI Wayne Mayo is a 38 y.o. male.     Wayne Mayo is a 38 y.o. male Who is otherwise healthy, presents to the ED for evaluation of sore throat.  Patient reports he woke up with a mild sore throat but throughout the day at work this became increasingly worse.  He reports pain with swallowing and making it difficult for him to eat, he was able to drink water without difficulty though.  He reports at work he started to feel like his throat was swelling and got very nervous and panicked.  He reports that he has had similar episodes in the past but has never sought medical attention.  He denies any associated fevers or chills.  No neck pain or swelling.  No difficulty breathing.  No rhinorrhea, cough, chest pain or shortness of breath, and no nausea or vomiting.  He denies any known sick contacts.  He does leave the home for work but has to wear a mask at all times.  No other aggravating or alleviating factors.       Past Medical History:  Diagnosis Date  . Arthritis   . Irregular heart beat   . kidney stones     There are no active problems to display for this patient.   History reviewed. No pertinent surgical history.      Home Medications    Prior to Admission medications   Medication Sig Start Date End Date Taking? Authorizing Provider  aspirin (BAYER ASPIRIN) 325 MG tablet Take 1 tablet (325 mg total) by mouth daily. 09/13/18   Derwood Kaplan, MD  diclofenac sodium (VOLTAREN) 1 % GEL Apply 2 g topically 4 (four) times daily. 09/16/18   McDonald, Mia A, PA-C  ibuprofen (ADVIL) 600 MG tablet Take 1 tablet (600 mg total) by mouth every 6 (six) hours as needed. 09/20/19   Dartha Lodge, PA-C  loperamide (IMODIUM) 2 MG capsule Take 1 capsule (2 mg total) by mouth 4  (four) times daily as needed for diarrhea or loose stools. 02/28/19   Horton, Mayer Masker, MD  meclizine (ANTIVERT) 25 MG tablet Take 1 tablet (25 mg total) by mouth 3 (three) times daily as needed for dizziness. 09/13/18   Derwood Kaplan, MD  ondansetron (ZOFRAN) 4 MG tablet Take 1 tablet (4 mg total) by mouth 4 (four) times daily as needed for nausea or vomiting. 08/09/18   Raeford Razor, MD  predniSONE (DELTASONE) 20 MG tablet Take 2 tablets (40 mg total) by mouth daily for 5 days. 09/20/19 09/25/19  Dartha Lodge, PA-C    Family History No family history on file.  Social History Social History   Tobacco Use  . Smoking status: Never Smoker  . Smokeless tobacco: Never Used  Substance Use Topics  . Alcohol use: Never    Frequency: Never  . Drug use: Never     Allergies   Penicillins   Review of Systems Review of Systems  Constitutional: Negative for chills and fever.  HENT: Positive for sore throat and trouble swallowing. Negative for congestion, ear pain, rhinorrhea and voice change.   Respiratory: Negative for cough and shortness of breath.   Cardiovascular: Negative for chest pain.  Gastrointestinal: Negative for abdominal pain, nausea and vomiting.  Musculoskeletal: Negative for  myalgias, neck pain and neck stiffness.  Skin: Negative for color change and rash.  Neurological: Negative for headaches.  All other systems reviewed and are negative.    Physical Exam Updated Vital Signs BP 121/85   Pulse (!) 59   Temp 99.2 F (37.3 C)   Resp 20   SpO2 99%   Physical Exam Vitals signs and nursing note reviewed.  Constitutional:      General: He is not in acute distress.    Appearance: He is well-developed and normal weight. He is not ill-appearing or diaphoretic.  HENT:     Head: Normocephalic and atraumatic.     Nose: No congestion or rhinorrhea.     Mouth/Throat:     Mouth: Mucous membranes are moist.     Pharynx: Uvula midline. Pharyngeal swelling and  posterior oropharyngeal erythema present. No oropharyngeal exudate or uvula swelling.     Tonsils: No tonsillar exudate or tonsillar abscesses. 2+ on the right. 2+ on the left.     Comments: Posterior oropharynx clear and mucous membranes moist, there is mild erythema and edema, no tonsillar exudates, uvula midline, normal phonation, no trismus, tolerating secretions without difficulty. Eyes:     General:        Right eye: No discharge.        Left eye: No discharge.  Neck:     Musculoskeletal: Neck supple.     Comments: No rigidity Cardiovascular:     Rate and Rhythm: Normal rate and regular rhythm.     Heart sounds: Normal heart sounds.  Pulmonary:     Effort: Pulmonary effort is normal. No respiratory distress.     Breath sounds: Normal breath sounds.     Comments: Respirations equal and unlabored, patient able to speak in full sentences, lungs clear to auscultation bilaterally Abdominal:     General: Bowel sounds are normal. There is no distension.     Palpations: Abdomen is soft. There is no mass.     Tenderness: There is no abdominal tenderness. There is no guarding.     Comments: Abdomen soft, nondistended, nontender to palpation in all quadrants without guarding or peritoneal signs  Musculoskeletal:        General: No deformity.  Lymphadenopathy:     Cervical: No cervical adenopathy.  Skin:    General: Skin is warm and dry.     Capillary Refill: Capillary refill takes less than 2 seconds.  Neurological:     Mental Status: He is alert and oriented to person, place, and time.  Psychiatric:        Mood and Affect: Mood normal.        Behavior: Behavior normal.      ED Treatments / Results  Labs (all labs ordered are listed, but only abnormal results are displayed) Labs Reviewed  GROUP A STREP BY PCR  CBC  BASIC METABOLIC PANEL    EKG None  Radiology No results found.  Procedures Procedures (including critical care time)  Medications Ordered in ED  Medications  dexamethasone (DECADRON) injection 10 mg (10 mg Intramuscular Given 09/20/19 2116)  ibuprofen (ADVIL) tablet 600 mg (600 mg Oral Given 09/20/19 2115)     Initial Impression / Assessment and Plan / ED Course  I have reviewed the triage vital signs and the nursing notes.  Pertinent labs & imaging results that were available during my care of the patient were reviewed by me and considered in my medical decision making (see chart for details).  38 year old male  presents to the ED for evaluation of sore throat, reports he felt like it was swelling at work today.  He denies associated fevers or chills.  No other URI symptoms.  No shortness of breath.  No known sick contacts.  Basic labs and strep PCR obtained from triage, strep test was negative and labs unremarkable.  On exam patient has some mild tonsillar edema and erythema but no exudates.  Given swelling that patient felt was much worse at work today will treat with steroids, patient reports he has had similar symptoms in the past we will have him follow-up with ENT.  Encouraged him to use NSAIDs and Cepacol throat lozenges as well.  Return precautions discussed.  Patient expresses understanding and agreement with plan.  Discharged home in good condition.  Final Clinical Impressions(s) / ED Diagnoses   Final diagnoses:  Strep throat    ED Discharge Orders         Ordered    predniSONE (DELTASONE) 20 MG tablet  Daily     09/20/19 2206    ibuprofen (ADVIL) 600 MG tablet  Every 6 hours PRN     09/20/19 2206           Jacqlyn Larsen, PA-C 09/23/19 2010    Deno Etienne, DO 09/23/19 2304

## 2019-11-16 IMAGING — DX PORTABLE CHEST - 1 VIEW
1 series · 1 of 1 positions shown · non-contrast
Comparison: 10/21/2018

CLINICAL DATA: Chest pain, cough

EXAM:
PORTABLE CHEST 1 VIEW

[chest ap]
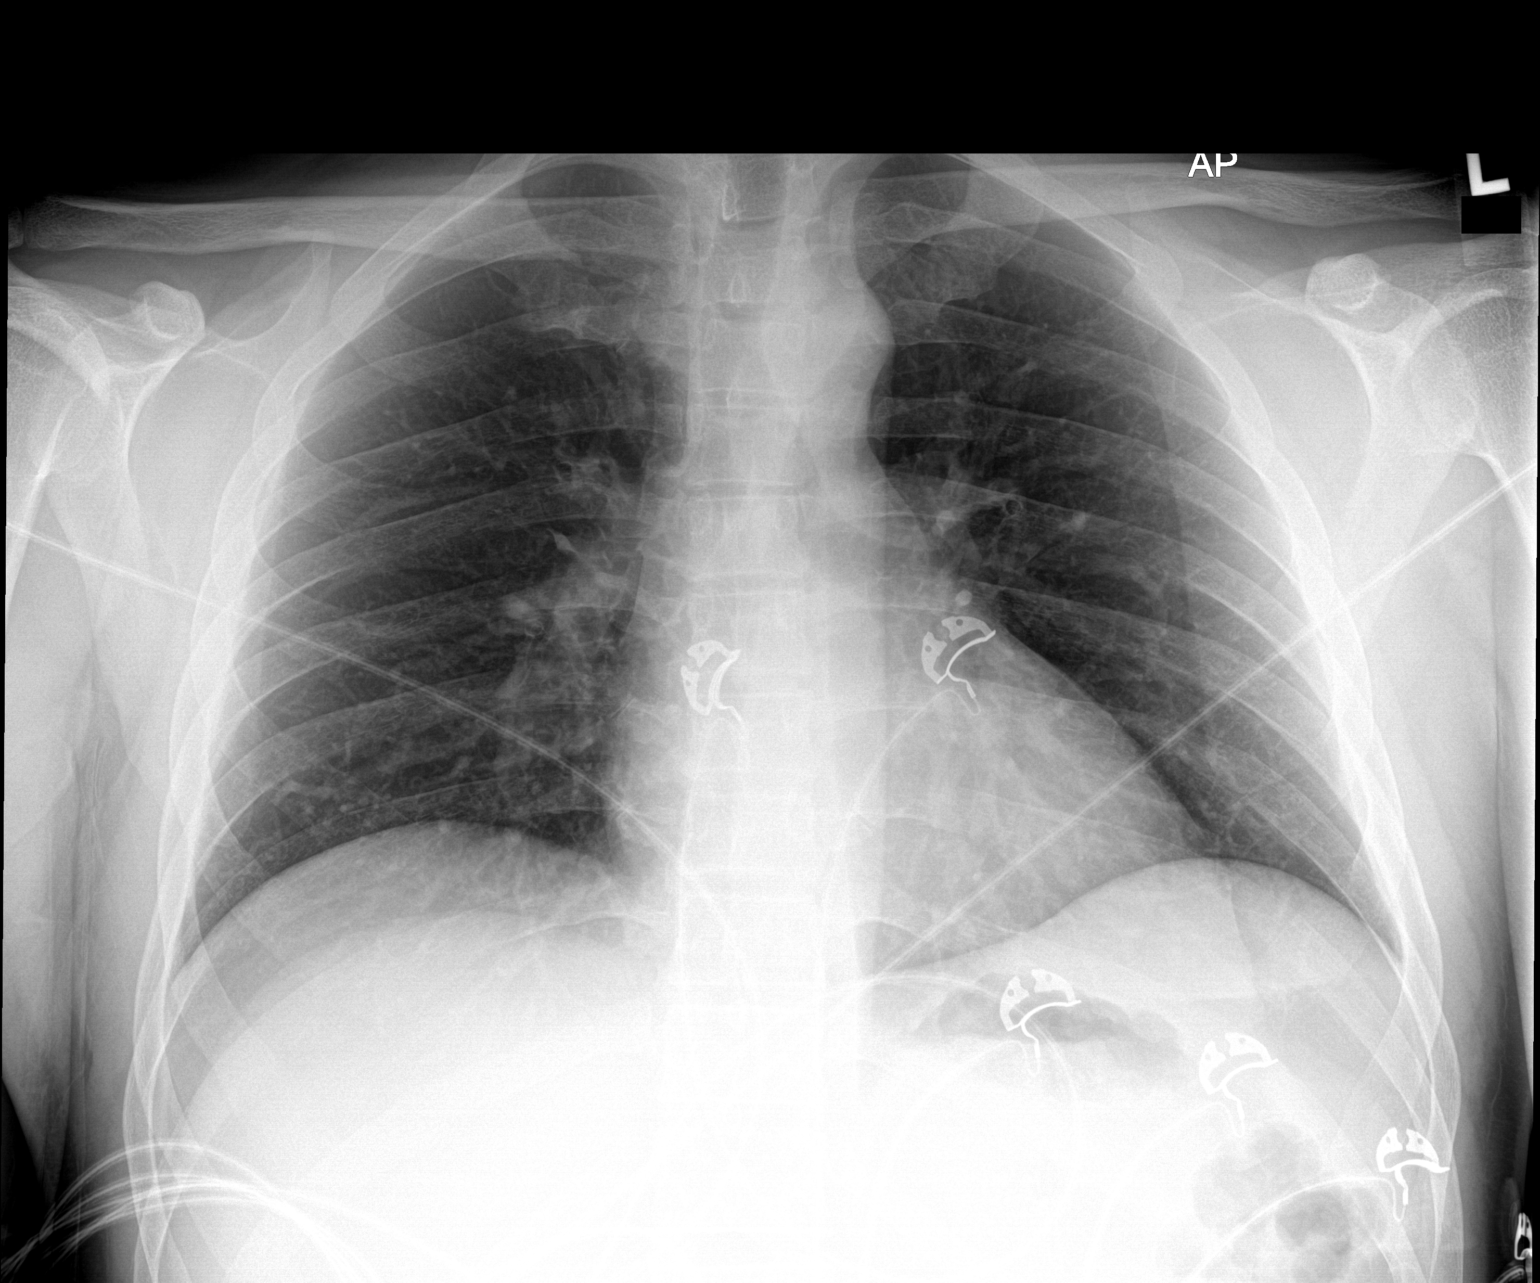

[1 of 1 positions shown; findings below may reference images not displayed]

FINDINGS: Heart and mediastinal contours are within normal limits. No focal
opacities or effusions. No acute bony abnormality.
IMPRESSION: No active disease.

## 2019-12-05 IMAGING — CR CHEST - 2 VIEW
2 series · 2 of 2 positions shown · non-contrast
Comparison: 02/28/2019 chest radiograph

CLINICAL DATA: 38 y/o  M; Chest Pain.

EXAM:
CHEST - 2 VIEW

[chest pa]
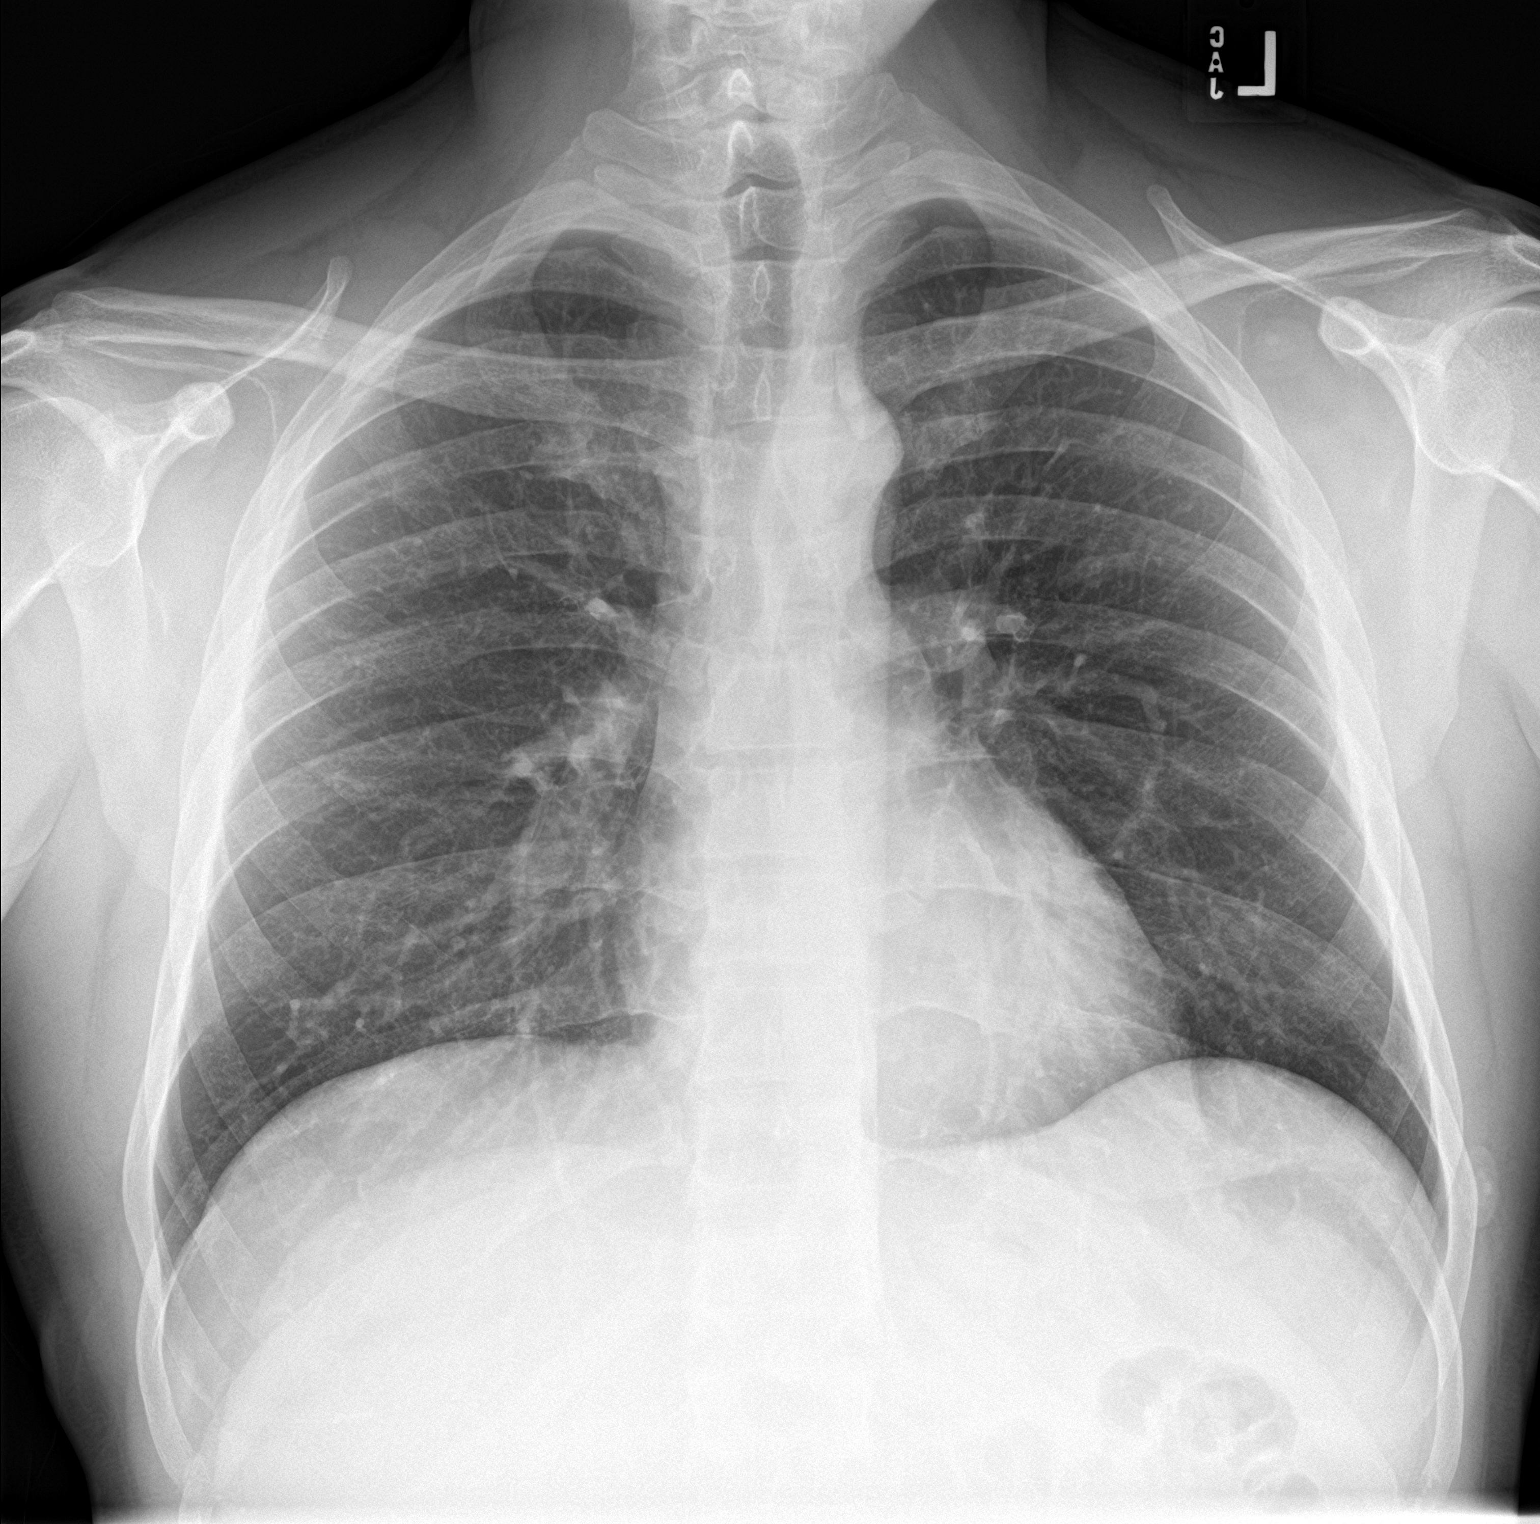

[chest lat]
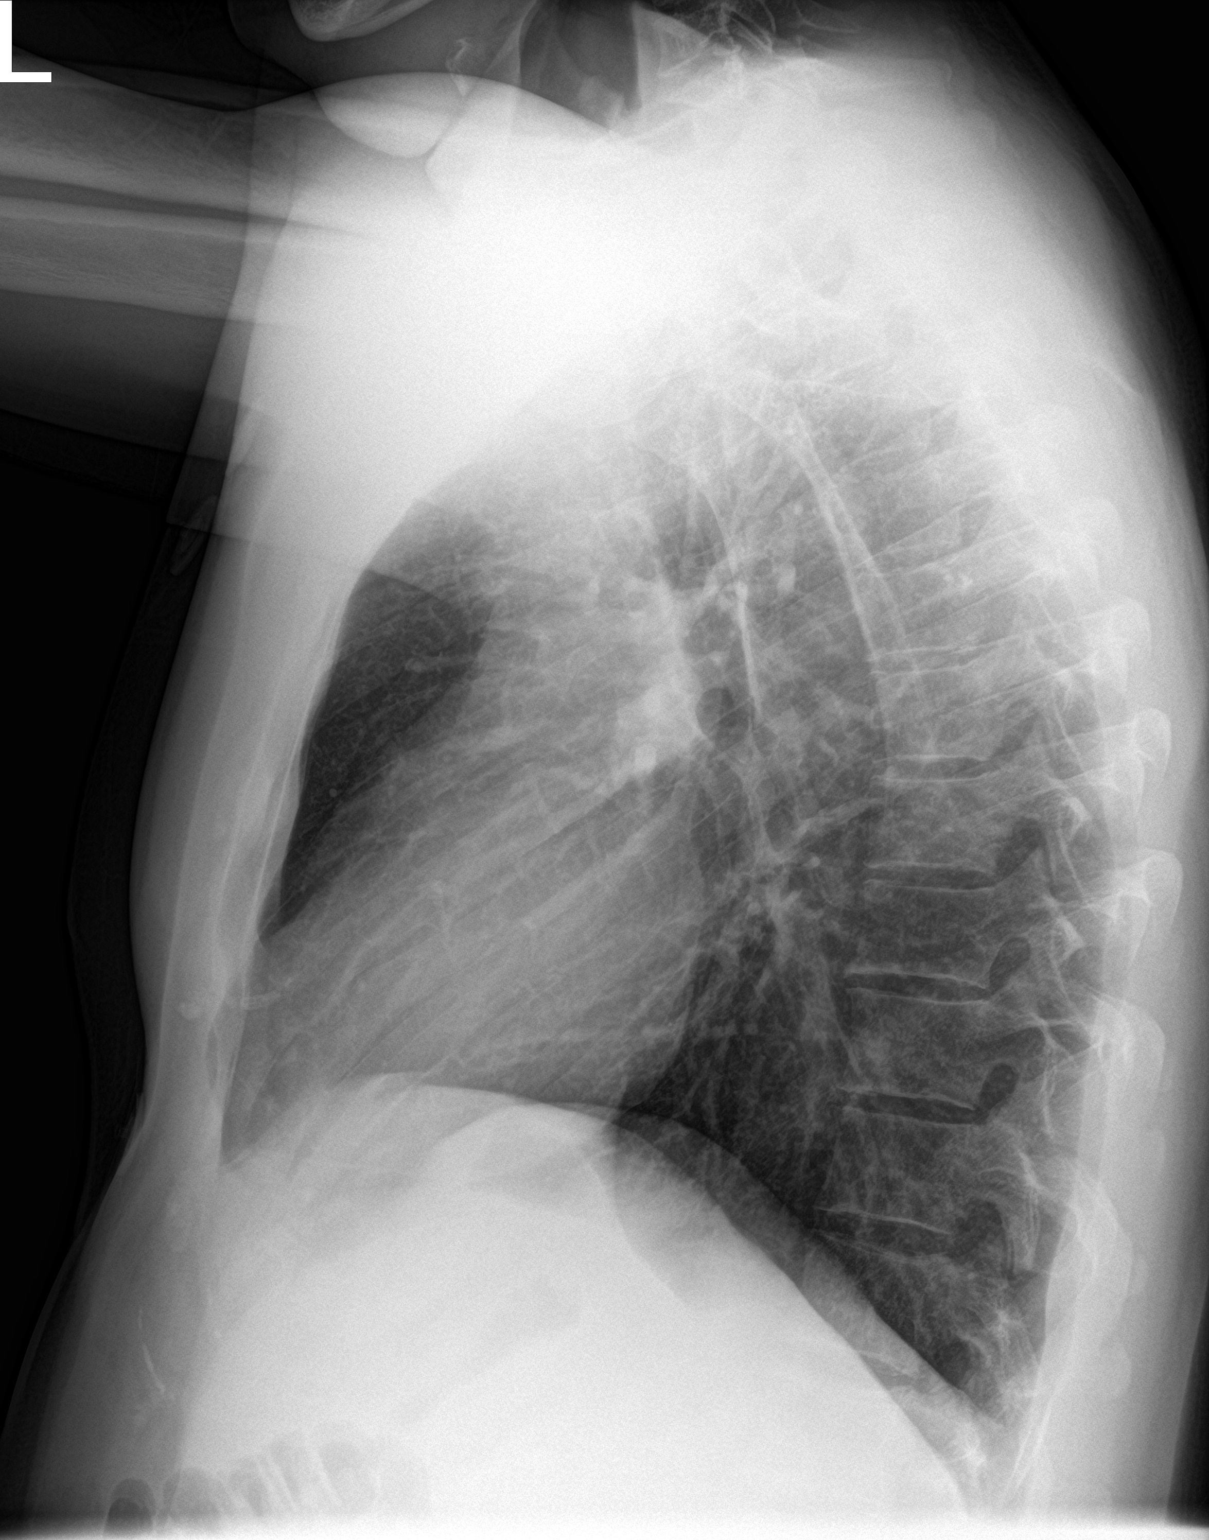

[2 of 2 positions shown; findings below may reference images not displayed]

FINDINGS: Stable heart size and mediastinal contours are within normal limits.
Both lungs are clear. The visualized skeletal structures are
unremarkable.
IMPRESSION: No acute pulmonary process identified.
